# Patient Record
Sex: Female | Born: 1950 | Race: White | Hispanic: No | Marital: Married | Smoking: Never smoker
Health system: Southern US, Community
[De-identification: ages and names within clinical notes are randomized; demographics above are authoritative.]

## PROBLEM LIST (undated history)

## (undated) DIAGNOSIS — R011 Cardiac murmur, unspecified: Secondary | ICD-10-CM

## (undated) DIAGNOSIS — I340 Nonrheumatic mitral (valve) insufficiency: Secondary | ICD-10-CM

## (undated) DIAGNOSIS — G43909 Migraine, unspecified, not intractable, without status migrainosus: Secondary | ICD-10-CM

## (undated) DIAGNOSIS — T7840XA Allergy, unspecified, initial encounter: Secondary | ICD-10-CM

## (undated) DIAGNOSIS — M8580 Other specified disorders of bone density and structure, unspecified site: Secondary | ICD-10-CM

## (undated) DIAGNOSIS — L309 Dermatitis, unspecified: Secondary | ICD-10-CM

## (undated) DIAGNOSIS — F4321 Adjustment disorder with depressed mood: Secondary | ICD-10-CM

## (undated) DIAGNOSIS — C50919 Malignant neoplasm of unspecified site of unspecified female breast: Secondary | ICD-10-CM

## (undated) DIAGNOSIS — M5137 Other intervertebral disc degeneration, lumbosacral region: Secondary | ICD-10-CM

## (undated) HISTORY — PX: TONSILLECTOMY: SUR1361

## (undated) HISTORY — DX: Cardiac murmur, unspecified: R01.1

## (undated) HISTORY — PX: BREAST EXCISIONAL BIOPSY: SUR124

---

## 1997-01-31 HISTORY — PX: DILATION AND CURETTAGE, DIAGNOSTIC / THERAPEUTIC: SUR384

## 2006-03-07 ENCOUNTER — Ambulatory Visit: Payer: Self-pay | Admitting: Internal Medicine

## 2007-02-01 DIAGNOSIS — C50919 Malignant neoplasm of unspecified site of unspecified female breast: Secondary | ICD-10-CM

## 2007-02-01 HISTORY — DX: Malignant neoplasm of unspecified site of unspecified female breast: C50.919

## 2007-06-26 ENCOUNTER — Ambulatory Visit: Payer: Self-pay | Admitting: General Surgery

## 2007-06-29 ENCOUNTER — Ambulatory Visit: Payer: Self-pay | Admitting: General Surgery

## 2007-07-02 DIAGNOSIS — C50911 Malignant neoplasm of unspecified site of right female breast: Secondary | ICD-10-CM | POA: Insufficient documentation

## 2007-07-02 HISTORY — PX: MASTECTOMY: SHX3

## 2007-07-25 ENCOUNTER — Ambulatory Visit: Payer: Self-pay | Admitting: General Surgery

## 2007-07-27 ENCOUNTER — Ambulatory Visit: Payer: Self-pay | Admitting: General Surgery

## 2007-09-01 ENCOUNTER — Ambulatory Visit: Payer: Self-pay | Admitting: Oncology

## 2007-09-06 ENCOUNTER — Ambulatory Visit: Payer: Self-pay | Admitting: Oncology

## 2007-10-02 ENCOUNTER — Ambulatory Visit: Payer: Self-pay | Admitting: Oncology

## 2007-12-02 ENCOUNTER — Ambulatory Visit: Payer: Self-pay | Admitting: Oncology

## 2008-01-01 ENCOUNTER — Ambulatory Visit: Payer: Self-pay | Admitting: Oncology

## 2008-01-10 ENCOUNTER — Ambulatory Visit: Payer: Self-pay | Admitting: Oncology

## 2008-01-29 ENCOUNTER — Ambulatory Visit: Payer: Self-pay | Admitting: Unknown Physician Specialty

## 2008-02-01 ENCOUNTER — Ambulatory Visit: Payer: Self-pay | Admitting: Oncology

## 2008-07-01 ENCOUNTER — Ambulatory Visit: Payer: Self-pay | Admitting: Oncology

## 2008-07-08 ENCOUNTER — Ambulatory Visit: Payer: Self-pay | Admitting: General Surgery

## 2008-07-17 ENCOUNTER — Ambulatory Visit: Payer: Self-pay | Admitting: Oncology

## 2008-07-31 ENCOUNTER — Ambulatory Visit: Payer: Self-pay | Admitting: Oncology

## 2008-12-31 ENCOUNTER — Ambulatory Visit: Payer: Self-pay | Admitting: Oncology

## 2009-01-21 ENCOUNTER — Ambulatory Visit: Payer: Self-pay | Admitting: Oncology

## 2009-01-31 ENCOUNTER — Ambulatory Visit: Payer: Self-pay | Admitting: Oncology

## 2009-07-01 ENCOUNTER — Ambulatory Visit: Payer: Self-pay | Admitting: Oncology

## 2009-07-08 ENCOUNTER — Ambulatory Visit: Payer: Self-pay | Admitting: Oncology

## 2009-07-16 ENCOUNTER — Ambulatory Visit: Payer: Self-pay | Admitting: Oncology

## 2009-07-31 ENCOUNTER — Ambulatory Visit: Payer: Self-pay | Admitting: Oncology

## 2010-01-12 ENCOUNTER — Ambulatory Visit: Payer: Self-pay | Admitting: Oncology

## 2010-01-13 LAB — CANCER ANTIGEN 27.29: CA 27.29: 23.8 U/mL (ref 0.0–38.6)

## 2010-01-31 ENCOUNTER — Ambulatory Visit: Payer: Self-pay | Admitting: Oncology

## 2010-07-12 ENCOUNTER — Ambulatory Visit: Payer: Self-pay | Admitting: Oncology

## 2010-07-15 ENCOUNTER — Ambulatory Visit: Payer: Self-pay | Admitting: Oncology

## 2010-07-22 ENCOUNTER — Ambulatory Visit: Payer: Self-pay | Admitting: Oncology

## 2010-08-01 ENCOUNTER — Ambulatory Visit: Payer: Self-pay | Admitting: Oncology

## 2010-12-13 ENCOUNTER — Ambulatory Visit: Payer: Self-pay | Admitting: Surgery

## 2011-01-19 ENCOUNTER — Encounter: Payer: Self-pay | Admitting: Obstetrics and Gynecology

## 2011-01-19 ENCOUNTER — Ambulatory Visit: Payer: Self-pay | Admitting: Oncology

## 2011-01-19 DIAGNOSIS — Z01419 Encounter for gynecological examination (general) (routine) without abnormal findings: Secondary | ICD-10-CM

## 2011-02-01 ENCOUNTER — Ambulatory Visit: Payer: Self-pay | Admitting: Oncology

## 2011-02-01 HISTORY — PX: COLONOSCOPY: SHX174

## 2011-02-09 ENCOUNTER — Ambulatory Visit (INDEPENDENT_AMBULATORY_CARE_PROVIDER_SITE_OTHER): Payer: Managed Care, Other (non HMO) | Admitting: Obstetrics and Gynecology

## 2011-02-09 ENCOUNTER — Encounter: Payer: Self-pay | Admitting: Obstetrics and Gynecology

## 2011-02-09 VITALS — BP 114/59 | HR 69 | Ht 65.5 in | Wt 132.5 lb

## 2011-02-09 DIAGNOSIS — Z1272 Encounter for screening for malignant neoplasm of vagina: Secondary | ICD-10-CM

## 2011-02-09 DIAGNOSIS — Z01419 Encounter for gynecological examination (general) (routine) without abnormal findings: Secondary | ICD-10-CM

## 2011-02-09 NOTE — Patient Instructions (Signed)

## 2011-02-09 NOTE — Progress Notes (Signed)
  Subjective:     Kristie Valencia is a 61 y.o. female with BMI 22 and is here for a comprehensive physical exam. The patient reports problems with vaginal dryness. Patient has been postmenopausal for at least 10 years and is being treated a SERM for breast cancer. Patient was prescribed estrace cream but never used it due to loss of applicator. Patient is otherwise without complaints. She is not sexually active due to the vaginal discomfort. Patient is otherwise all up to date with her health care maintenance. She is scheduled for a mammogram in June, she had a colonoscopy 2-3 years ago. She had a bone density scan and had all blood work done last year by her PCP.  History   Social History  . Marital Status: Married    Spouse Name: N/A    Number of Children: N/A  . Years of Education: N/A   Occupational History  . Not on file.   Social History Main Topics  . Smoking status: Never Smoker   . Smokeless tobacco: Not on file  . Alcohol Use: 0.6 oz/week    1 Glasses of wine per week  . Drug Use: No  . Sexually Active: Yes   Other Topics Concern  . Not on file   Social History Narrative  . No narrative on file   No health maintenance topics applied.  Past Medical History  Diagnosis Date  . Cancer     Past Surgical History  Procedure Date  . Tonsillectomy   . Mastectomy 07/02/07    rt breast   Family History  Problem Relation Age of Onset  . Cancer Mother   . Heart disease Mother   . Cancer Father        Review of Systems A comprehensive review of systems was negative.   Objective:     GENERAL: Well-developed, well-nourished female in no acute distress.  HEENT: Normocephalic, atraumatic. Sclerae anicteric.  NECK: Supple. Normal thyroid.  LUNGS: Clear to auscultation bilaterally.  HEART: Regular rate and rhythm. BREASTS: Right breast surgically removed. No palpable masses or lymphadenopathy, skin changes, or nipple drainage. ABDOMEN: Soft, nontender, nondistended.  No organomegaly. PELVIC: Normal external female genitalia. Vagina is atrophic. Atrophic appearing cervix. Uterus is normal in size. No adnexal mass or tenderness. EXTREMITIES: No cyanosis, clubbing, or edema, 2+ distal pulses.    Assessment:    Healthy female exam.     Plan:    Pap smear collected Patient advised to continue monthly self breast and vulva exam Patient to continue taking calcium and vitamin D supplements Patient advised to use estrace cream (sample with applicator provided). Patient to use 1-3 times weekly and gradually decrease the frequency until improvement of symptoms Patient will be contacted with any abnormal results See After Visit Summary for Counseling Recommendations

## 2011-07-13 ENCOUNTER — Ambulatory Visit: Payer: Self-pay | Admitting: Oncology

## 2011-07-15 ENCOUNTER — Ambulatory Visit: Payer: Self-pay | Admitting: Oncology

## 2011-08-03 ENCOUNTER — Ambulatory Visit: Payer: Self-pay | Admitting: Oncology

## 2011-08-03 LAB — COMPREHENSIVE METABOLIC PANEL
Anion Gap: 2 — ABNORMAL LOW (ref 7–16)
Bilirubin,Total: 0.3 mg/dL (ref 0.2–1.0)
Calcium, Total: 9.5 mg/dL (ref 8.5–10.1)
Chloride: 107 mmol/L (ref 98–107)
Co2: 33 mmol/L — ABNORMAL HIGH (ref 21–32)
Creatinine: 0.85 mg/dL (ref 0.60–1.30)
EGFR (African American): >60
EGFR (Non-African Amer.): >60
Glucose: 94 mg/dL (ref 65–99)
SGOT(AST): 27 U/L (ref 15–37)
SGPT (ALT): 29 U/L
Sodium: 142 mmol/L (ref 136–145)
Total Protein: 6.9 g/dL (ref 6.4–8.2)

## 2011-08-03 LAB — CBC CANCER CENTER
Basophil #: 0 x10 3/mm (ref 0.0–0.1)
Basophil %: 0.4 %
Eosinophil #: 0.1 x10 3/mm (ref 0.0–0.7)
Eosinophil %: 1.2 %
HCT: 39.6 % (ref 35.0–47.0)
HGB: 13.1 g/dL (ref 12.0–16.0)
Lymphocyte #: 1.4 x10 3/mm (ref 1.0–3.6)
Lymphocyte %: 22.1 %
MCHC: 33 g/dL (ref 32.0–36.0)
MCV: 96 fL (ref 80–100)
Monocyte #: 0.6 x10 3/mm (ref 0.2–0.9)
Monocyte %: 10.1 %
Neutrophil #: 4.2 x10 3/mm (ref 1.4–6.5)
RBC: 4.14 10*6/uL (ref 3.80–5.20)
RDW: 11.7 % (ref 11.5–14.5)

## 2011-08-11 ENCOUNTER — Other Ambulatory Visit: Payer: Self-pay | Admitting: General Surgery

## 2011-08-11 DIAGNOSIS — R921 Mammographic calcification found on diagnostic imaging of breast: Secondary | ICD-10-CM

## 2011-08-18 ENCOUNTER — Ambulatory Visit
Admission: RE | Admit: 2011-08-18 | Discharge: 2011-08-18 | Disposition: A | Discharge status: Home / Self Care | Payer: Managed Care, Other (non HMO) | Source: Ambulatory Visit | Attending: General Surgery | Admitting: General Surgery

## 2011-08-18 DIAGNOSIS — R921 Mammographic calcification found on diagnostic imaging of breast: Secondary | ICD-10-CM

## 2011-08-18 MED ORDER — GADOBENATE DIMEGLUMINE 529 MG/ML IV SOLN
12.0000 mL | Freq: Once | INTRAVENOUS | Status: AC | PRN
  Administered 2011-08-18: 12 mL via INTRAVENOUS

## 2011-09-01 ENCOUNTER — Ambulatory Visit: Payer: Self-pay | Admitting: Oncology

## 2011-09-01 HISTORY — PX: BREAST BIOPSY: SHX20

## 2011-09-05 ENCOUNTER — Ambulatory Visit: Payer: Self-pay | Admitting: General Surgery

## 2011-09-06 LAB — PATHOLOGY REPORT

## 2012-02-06 ENCOUNTER — Ambulatory Visit: Payer: Self-pay | Admitting: Oncology

## 2012-02-06 LAB — CBC CANCER CENTER
Basophil #: 0.1 x10 3/mm (ref 0.0–0.1)
Eosinophil #: 0.1 x10 3/mm (ref 0.0–0.7)
Eosinophil %: 2 %
HCT: 40.6 % (ref 35.0–47.0)
HGB: 14 g/dL (ref 12.0–16.0)
Lymphocyte #: 1.6 x10 3/mm (ref 1.0–3.6)
Lymphocyte %: 29.4 %
MCV: 93 fL (ref 80–100)
Monocyte %: 12.6 %
Neutrophil %: 54.9 %
RBC: 4.37 10*6/uL (ref 3.80–5.20)
RDW: 11.7 % (ref 11.5–14.5)
WBC: 5.4 x10 3/mm (ref 3.6–11.0)

## 2012-02-06 LAB — COMPREHENSIVE METABOLIC PANEL
Alkaline Phosphatase: 88 U/L (ref 50–136)
Bilirubin,Total: 0.2 mg/dL (ref 0.2–1.0)
Calcium, Total: 8.9 mg/dL (ref 8.5–10.1)
Chloride: 102 mmol/L (ref 98–107)
Co2: 30 mmol/L (ref 21–32)
EGFR (African American): >60
EGFR (Non-African Amer.): >60
Glucose: 97 mg/dL (ref 65–99)
Osmolality: 281 (ref 275–301)
Potassium: 4.3 mmol/L (ref 3.5–5.1)
SGOT(AST): 27 U/L (ref 15–37)
Total Protein: 7.3 g/dL (ref 6.4–8.2)

## 2012-02-07 LAB — CANCER ANTIGEN 27.29: CA 27.29: 19.6 U/mL (ref 0.0–38.6)

## 2012-02-15 ENCOUNTER — Ambulatory Visit: Payer: Self-pay | Admitting: General Surgery

## 2012-03-03 ENCOUNTER — Ambulatory Visit: Payer: Self-pay | Admitting: Oncology

## 2012-07-31 ENCOUNTER — Ambulatory Visit: Payer: Self-pay | Admitting: Internal Medicine

## 2012-08-10 ENCOUNTER — Encounter: Payer: Self-pay | Admitting: *Deleted

## 2012-08-13 LAB — COMPREHENSIVE METABOLIC PANEL
Alkaline Phosphatase: 88 U/L (ref 50–136)
BUN: 19 mg/dL — ABNORMAL HIGH (ref 7–18)
Bilirubin,Total: 0.3 mg/dL (ref 0.2–1.0)
Chloride: 104 mmol/L (ref 98–107)
EGFR (African American): >60
Glucose: 95 mg/dL (ref 65–99)
Osmolality: 283 (ref 275–301)
Potassium: 4.2 mmol/L (ref 3.5–5.1)
SGPT (ALT): 26 U/L (ref 12–78)
Sodium: 141 mmol/L (ref 136–145)
Total Protein: 7 g/dL (ref 6.4–8.2)

## 2012-08-13 LAB — CBC CANCER CENTER
Basophil #: 0 x10 3/mm (ref 0.0–0.1)
Basophil %: 0.8 %
Lymphocyte %: 26.9 %
Monocyte %: 11.8 %
Neutrophil %: 59.3 %
RDW: 11.9 % (ref 11.5–14.5)
WBC: 5.4 x10 3/mm (ref 3.6–11.0)

## 2012-08-20 ENCOUNTER — Telehealth: Payer: Self-pay | Admitting: *Deleted

## 2012-08-20 NOTE — Telephone Encounter (Signed)
Patient was contacted today to let her know that we faxed a prescription to [facility] for two mastectomy bras. The original prescription was also mailed to the patient. She verbalizes understanding.

## 2012-08-31 ENCOUNTER — Ambulatory Visit: Payer: Self-pay | Admitting: Internal Medicine

## 2012-12-24 ENCOUNTER — Encounter: Payer: Managed Care, Other (non HMO) | Admitting: Family Medicine

## 2013-02-18 ENCOUNTER — Ambulatory Visit: Payer: Self-pay | Admitting: Oncology

## 2013-03-12 ENCOUNTER — Ambulatory Visit: Payer: Self-pay | Admitting: General Surgery

## 2013-04-09 ENCOUNTER — Encounter: Payer: Self-pay | Admitting: *Deleted

## 2013-09-04 ENCOUNTER — Ambulatory Visit: Payer: Self-pay | Admitting: Oncology

## 2013-10-01 ENCOUNTER — Ambulatory Visit: Payer: Self-pay | Admitting: Oncology

## 2013-12-02 ENCOUNTER — Encounter: Payer: Self-pay | Admitting: *Deleted

## 2014-02-19 ENCOUNTER — Ambulatory Visit: Payer: Self-pay | Admitting: Oncology

## 2014-09-11 ENCOUNTER — Inpatient Hospital Stay: Payer: Managed Care, Other (non HMO) | Admitting: Oncology

## 2014-09-17 ENCOUNTER — Inpatient Hospital Stay: Payer: Managed Care, Other (non HMO) | Attending: Oncology | Admitting: Oncology

## 2014-09-17 DIAGNOSIS — Z809 Family history of malignant neoplasm, unspecified: Secondary | ICD-10-CM | POA: Insufficient documentation

## 2014-09-17 DIAGNOSIS — F329 Major depressive disorder, single episode, unspecified: Secondary | ICD-10-CM | POA: Insufficient documentation

## 2014-09-17 DIAGNOSIS — M5137 Other intervertebral disc degeneration, lumbosacral region: Secondary | ICD-10-CM | POA: Insufficient documentation

## 2014-09-17 DIAGNOSIS — Z9011 Acquired absence of right breast and nipple: Secondary | ICD-10-CM | POA: Insufficient documentation

## 2014-09-17 DIAGNOSIS — Z853 Personal history of malignant neoplasm of breast: Secondary | ICD-10-CM | POA: Insufficient documentation

## 2014-09-17 DIAGNOSIS — I340 Nonrheumatic mitral (valve) insufficiency: Secondary | ICD-10-CM | POA: Insufficient documentation

## 2014-09-17 DIAGNOSIS — R011 Cardiac murmur, unspecified: Secondary | ICD-10-CM | POA: Insufficient documentation

## 2014-09-17 DIAGNOSIS — Z8669 Personal history of other diseases of the nervous system and sense organs: Secondary | ICD-10-CM | POA: Insufficient documentation

## 2014-09-17 DIAGNOSIS — L309 Dermatitis, unspecified: Secondary | ICD-10-CM | POA: Insufficient documentation

## 2014-09-17 DIAGNOSIS — M8580 Other specified disorders of bone density and structure, unspecified site: Secondary | ICD-10-CM | POA: Insufficient documentation

## 2014-09-17 DIAGNOSIS — Z79899 Other long term (current) drug therapy: Secondary | ICD-10-CM | POA: Insufficient documentation

## 2014-09-26 ENCOUNTER — Encounter: Payer: Self-pay | Admitting: *Deleted

## 2014-09-29 ENCOUNTER — Ambulatory Visit
Admission: RE | Admit: 2014-09-29 | Discharge: 2014-09-29 | Disposition: A | Discharge status: Home / Self Care | Payer: Managed Care, Other (non HMO) | Source: Ambulatory Visit | Attending: Unknown Physician Specialty | Admitting: Unknown Physician Specialty

## 2014-09-29 ENCOUNTER — Encounter
Admission: RE | Disposition: A | Discharge status: Home / Self Care | Payer: Self-pay | Source: Ambulatory Visit | Attending: Unknown Physician Specialty

## 2014-09-29 ENCOUNTER — Encounter: Payer: Self-pay | Admitting: *Deleted

## 2014-09-29 ENCOUNTER — Ambulatory Visit: Payer: Managed Care, Other (non HMO) | Admitting: Anesthesiology

## 2014-09-29 DIAGNOSIS — M1990 Unspecified osteoarthritis, unspecified site: Secondary | ICD-10-CM | POA: Insufficient documentation

## 2014-09-29 DIAGNOSIS — Z800 Family history of malignant neoplasm of digestive organs: Secondary | ICD-10-CM | POA: Insufficient documentation

## 2014-09-29 DIAGNOSIS — K640 First degree hemorrhoids: Secondary | ICD-10-CM | POA: Diagnosis not present

## 2014-09-29 DIAGNOSIS — I340 Nonrheumatic mitral (valve) insufficiency: Secondary | ICD-10-CM | POA: Insufficient documentation

## 2014-09-29 DIAGNOSIS — Z809 Family history of malignant neoplasm, unspecified: Secondary | ICD-10-CM | POA: Diagnosis not present

## 2014-09-29 DIAGNOSIS — L309 Dermatitis, unspecified: Secondary | ICD-10-CM | POA: Insufficient documentation

## 2014-09-29 DIAGNOSIS — Z853 Personal history of malignant neoplasm of breast: Secondary | ICD-10-CM | POA: Insufficient documentation

## 2014-09-29 DIAGNOSIS — R011 Cardiac murmur, unspecified: Secondary | ICD-10-CM | POA: Diagnosis not present

## 2014-09-29 DIAGNOSIS — Z9011 Acquired absence of right breast and nipple: Secondary | ICD-10-CM | POA: Diagnosis not present

## 2014-09-29 DIAGNOSIS — F4321 Adjustment disorder with depressed mood: Secondary | ICD-10-CM | POA: Diagnosis not present

## 2014-09-29 DIAGNOSIS — M8580 Other specified disorders of bone density and structure, unspecified site: Secondary | ICD-10-CM | POA: Insufficient documentation

## 2014-09-29 DIAGNOSIS — D123 Benign neoplasm of transverse colon: Secondary | ICD-10-CM | POA: Insufficient documentation

## 2014-09-29 DIAGNOSIS — Z1211 Encounter for screening for malignant neoplasm of colon: Secondary | ICD-10-CM | POA: Diagnosis not present

## 2014-09-29 HISTORY — DX: Migraine, unspecified, not intractable, without status migrainosus: G43.909

## 2014-09-29 HISTORY — DX: Dermatitis, unspecified: L30.9

## 2014-09-29 HISTORY — DX: Allergy, unspecified, initial encounter: T78.40XA

## 2014-09-29 HISTORY — PX: COLONOSCOPY WITH PROPOFOL: SHX5780

## 2014-09-29 HISTORY — DX: Adjustment disorder with depressed mood: F43.21

## 2014-09-29 HISTORY — DX: Nonrheumatic mitral (valve) insufficiency: I34.0

## 2014-09-29 HISTORY — DX: Malignant neoplasm of unspecified site of unspecified female breast: C50.919

## 2014-09-29 HISTORY — DX: Other specified disorders of bone density and structure, unspecified site: M85.80

## 2014-09-29 HISTORY — DX: Other intervertebral disc degeneration, lumbosacral region: M51.37

## 2014-09-29 SURGERY — COLONOSCOPY WITH PROPOFOL
Anesthesia: General

## 2014-09-29 MED ORDER — LIDOCAINE HCL (CARDIAC) 20 MG/ML IV SOLN
INTRAVENOUS | Status: DC | PRN
  Administered 2014-09-29: 60 mg via INTRAVENOUS

## 2014-09-29 MED ORDER — PROPOFOL 10 MG/ML IV BOLUS
INTRAVENOUS | Status: DC | PRN
  Administered 2014-09-29: 80 mg via INTRAVENOUS

## 2014-09-29 MED ORDER — SODIUM CHLORIDE 0.9 % IV SOLN: INTRAVENOUS | Status: DC

## 2014-09-29 MED ORDER — PROPOFOL INFUSION 10 MG/ML OPTIME
INTRAVENOUS | Status: DC | PRN
  Administered 2014-09-29: 140 ug/kg/min via INTRAVENOUS

## 2014-09-29 MED ORDER — SODIUM CHLORIDE 0.9 % IV SOLN
INTRAVENOUS | Status: DC
  Administered 2014-09-29: 15:00:00 via INTRAVENOUS

## 2014-09-29 NOTE — H&P (Signed)
   Primary Care Physician:  Adin Hector, MD Primary Gastroenterologist:  Dr. Vira Agar  Pre-Procedure History & Physical: HPI:  Kristie Valencia is a 64 y.o. female is here for an colonoscopy.   Past Medical History  Diagnosis Date  . Heart murmur   . Cancer 2009    Right mastectomy  . Allergic state   . Osteopenia   . Eczema   . Situational depression   . Migraine   . Mitral regurgitation   . Breast cancer Right    Past Surgical History  Procedure Laterality Date  . Tonsillectomy    . Mastectomy  07/02/07    rt breast  . Breast surgery Right     infiltrating ductal carcinoma  . Tonsillectomy    . Colonoscopy      Prior to Admission medications   Medication Sig Start Date End Date Taking? Authorizing Provider  zolpidem (AMBIEN) 5 MG tablet Take 5 mg by mouth at bedtime as needed for sleep.   Yes Historical Provider, MD  anastrozole (ARIMIDEX) 1 MG tablet Take 1 mg by mouth daily.    Historical Provider, MD  Multiple Vitamin (MULTIVITAMIN) tablet Take 1 tablet by mouth daily.    Historical Provider, MD    Allergies as of 07/22/2014  . (No Known Allergies)    Family History  Problem Relation Age of Onset  . Cancer Mother   . Heart disease Mother   . Cancer Father     Social History   Social History  . Marital Status: Married    Spouse Name: N/A  . Number of Children: N/A  . Years of Education: N/A   Occupational History  . Not on file.   Social History Main Topics  . Smoking status: Never Smoker   . Smokeless tobacco: Never Used  . Alcohol Use: 0.6 oz/week    1 Glasses of wine per week  . Drug Use: No  . Sexual Activity: Yes   Other Topics Concern  . Not on file   Social History Narrative    Review of Systems: See HPI, otherwise negative ROS  Physical Exam: BP 119/57 mmHg  Pulse 62  Temp(Src) 96.7 F (35.9 C) (Tympanic)  Resp 20  Ht 5\' 5"  (1.651 m)  Wt 61.236 kg (135 lb)  BMI 22.47 kg/m2  SpO2 99% General:   Alert,  pleasant and  cooperative in NAD Head:  Normocephalic and atraumatic. Neck:  Supple; no masses or thyromegaly. Lungs:  Clear throughout to auscultation.    Heart:  Regular rate and rhythm. Abdomen:  Soft, nontender and nondistended. Normal bowel sounds, without guarding, and without rebound.   Neurologic:  Alert and  oriented x4;  grossly normal neurologically.  Impression/Plan: Kristie Valencia is here for an colonoscopy to be performed for FH colon cancer in father  Risks, benefits, limitations, and alternatives regarding  colonoscopy have been reviewed with the patient.  Questions have been answered.  All parties agreeable.   Gaylyn Cheers, MD  09/29/2014, 3:26 PM

## 2014-09-29 NOTE — Transfer of Care (Signed)
Immediate Anesthesia Transfer of Care Note  Patient: Kristie Valencia  Procedure(s) Performed: Procedure(s): COLONOSCOPY WITH PROPOFOL (N/A)  Patient Location: PACU  Anesthesia Type:General  Level of Consciousness: awake, alert  and oriented  Airway & Oxygen Therapy: Patient Spontanous Breathing and Patient connected to nasal cannula oxygen  Post-op Assessment: Report given to RN and Post -op Vital signs reviewed and stable  Post vital signs: Reviewed and stable  Last Vitals:  Filed Vitals:   09/29/14 1556  BP: 117/45  Pulse: 68  Temp: 36.9 C  Resp: 16    Complications: No apparent anesthesia complications

## 2014-09-29 NOTE — Op Note (Signed)
[facility] Gastroenterology Patient Name: Kristie Valencia Procedure Date: 09/29/2014 3:16 PM MRN: [id number] Account #: [id number] Date of Birth: 09-14-1950 Admit Type: Outpatient Age: 64 Room: [facility] ENDO ROOM 1 Gender: Female Note Status: Finalized Procedure:         Colonoscopy Indications:       Screening in patient at increased risk: Family history of                     1st-degree relative with colorectal cancer Providers:         Manya Silvas, MD Referring MD:      Ramonita Lab, MD (Referring MD) Medicines:         Propofol per Anesthesia Complications:     No immediate complications. Procedure:         Pre-Anesthesia Assessment:                    - After reviewing the risks and benefits, the patient was                     deemed in satisfactory condition to undergo the procedure.                    After obtaining informed consent, the colonoscope was                     passed under direct vision. Throughout the procedure, the                     patient's blood pressure, pulse, and oxygen saturations                     were monitored continuously. The Colonoscope was                     introduced through the anus and advanced to the the cecum,                     identified by appendiceal orifice and ileocecal valve. The                     colonoscopy was performed without difficulty. The patient                     tolerated the procedure well. The quality of the bowel                     preparation was good. Findings:      A small polyp was found in the transverse colon. The polyp was sessile.       The polyp was removed with a hot snare. Resection and retrieval were       complete. A clip was applied to the small divot.      Internal hemorrhoids were found during endoscopy. The hemorrhoids were       small and Grade I (internal hemorrhoids that do not prolapse). Impression:        - One small polyp in the transverse colon. Resected and                  retrieved.                    - Internal hemorrhoids. Recommendation:    - Await pathology results.                    -  The findings and recommendations were discussed with the                     patient's family. Manya Silvas, MD 09/29/2014 3:54:10 PM This report has been signed electronically. Number of Addenda: 0 Note Initiated On: 09/29/2014 3:16 PM Scope Withdrawal Time: 0 hours 13 minutes 36 seconds  Total Procedure Duration: 0 hours 18 minutes 18 seconds       [facility]

## 2014-09-29 NOTE — Anesthesia Preprocedure Evaluation (Signed)
Anesthesia Evaluation  Patient identified by MRN, date of birth, ID band Patient awake    Reviewed: Allergy & Precautions, NPO status , Patient's Chart, lab work & pertinent test results, reviewed documented beta blocker date and time   Airway Mallampati: II  TM Distance: >3 FB     Dental  (+) Chipped   Pulmonary          Cardiovascular + Valvular Problems/Murmurs     Neuro/Psych  Headaches, PSYCHIATRIC DISORDERS Depression    GI/Hepatic   Endo/Other    Renal/GU      Musculoskeletal  (+) Arthritis -,   Abdominal   Peds  Hematology   Anesthesia Other Findings   Reproductive/Obstetrics                             Anesthesia Physical Anesthesia Plan  ASA: II  Anesthesia Plan: General   Post-op Pain Management:    Induction:   Airway Management Planned: Nasal Cannula  Additional Equipment:   Intra-op Plan:   Post-operative Plan:   Informed Consent: I have reviewed the patients History and Physical, chart, labs and discussed the procedure including the risks, benefits and alternatives for the proposed anesthesia with the patient or authorized representative who has indicated his/her understanding and acceptance.     Plan Discussed with: CRNA  Anesthesia Plan Comments:         Anesthesia Quick Evaluation

## 2014-09-30 ENCOUNTER — Encounter: Payer: Self-pay | Admitting: Unknown Physician Specialty

## 2014-10-01 ENCOUNTER — Inpatient Hospital Stay (HOSPITAL_BASED_OUTPATIENT_CLINIC_OR_DEPARTMENT_OTHER): Payer: Managed Care, Other (non HMO) | Admitting: Oncology

## 2014-10-01 ENCOUNTER — Encounter: Payer: Self-pay | Admitting: Oncology

## 2014-10-01 VITALS — BP 114/69 | HR 54 | Temp 95.5°F | Wt 137.4 lb

## 2014-10-01 DIAGNOSIS — Z8669 Personal history of other diseases of the nervous system and sense organs: Secondary | ICD-10-CM | POA: Diagnosis not present

## 2014-10-01 DIAGNOSIS — M5137 Other intervertebral disc degeneration, lumbosacral region: Secondary | ICD-10-CM | POA: Diagnosis not present

## 2014-10-01 DIAGNOSIS — R011 Cardiac murmur, unspecified: Secondary | ICD-10-CM

## 2014-10-01 DIAGNOSIS — Z809 Family history of malignant neoplasm, unspecified: Secondary | ICD-10-CM | POA: Diagnosis not present

## 2014-10-01 DIAGNOSIS — I340 Nonrheumatic mitral (valve) insufficiency: Secondary | ICD-10-CM

## 2014-10-01 DIAGNOSIS — L309 Dermatitis, unspecified: Secondary | ICD-10-CM | POA: Diagnosis not present

## 2014-10-01 DIAGNOSIS — M51379 Other intervertebral disc degeneration, lumbosacral region without mention of lumbar back pain or lower extremity pain: Secondary | ICD-10-CM | POA: Insufficient documentation

## 2014-10-01 DIAGNOSIS — F329 Major depressive disorder, single episode, unspecified: Secondary | ICD-10-CM

## 2014-10-01 DIAGNOSIS — M8580 Other specified disorders of bone density and structure, unspecified site: Secondary | ICD-10-CM | POA: Insufficient documentation

## 2014-10-01 DIAGNOSIS — Z9011 Acquired absence of right breast and nipple: Secondary | ICD-10-CM | POA: Diagnosis not present

## 2014-10-01 DIAGNOSIS — Z79899 Other long term (current) drug therapy: Secondary | ICD-10-CM

## 2014-10-01 DIAGNOSIS — Z853 Personal history of malignant neoplasm of breast: Secondary | ICD-10-CM

## 2014-10-01 HISTORY — DX: Other intervertebral disc degeneration, lumbosacral region: M51.37

## 2014-10-01 HISTORY — DX: Other intervertebral disc degeneration, lumbosacral region without mention of lumbar back pain or lower extremity pain: M51.379

## 2014-10-01 LAB — SURGICAL PATHOLOGY

## 2014-10-01 NOTE — Anesthesia Postprocedure Evaluation (Signed)
  Anesthesia Post-op Note  Patient: Kristie Valencia  Procedure(s) Performed: Procedure(s): COLONOSCOPY WITH PROPOFOL (N/A)  Anesthesia type:General  Patient location: PACU  Post pain: Pain level controlled  Post assessment: Post-op Vital signs reviewed, Patient's Cardiovascular Status Stable, Respiratory Function Stable, Patent Airway and No signs of Nausea or vomiting  Post vital signs: Reviewed and stable  Last Vitals:  Filed Vitals:   09/29/14 1625  BP: 139/44  Pulse: 63  Temp:   Resp: 15    Level of consciousness: awake, alert  and patient cooperative  Complications: No apparent anesthesia complications

## 2014-10-01 NOTE — Progress Notes (Signed)
Patient does not have living will.  Never smoked. 

## 2014-10-06 NOTE — Progress Notes (Signed)
[place] @ [facility] [phone]  Fax:([phone]) [place]: July 16, 1950  MR#: [id number]  OVA#:[id number]  Patient Care Team: Adin Hector, MD as PCP - General (Internal Medicine)  CHIEF COMPLAINT:  Chief Complaint/Diagnosis:   carcinoma of breast, status post modified radical mastectomy.  Right breast, June of 2009. 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  Surgeon receptor progesterone receptor positive.  Patient is not on any anti-hormonal therapy at present time.  Patient has finished 5 years of anti-hormonal therapy.  Getting regular mammograms done  REVIEW OF SYSTEMS:   GENERAL:  Feels good.  Active.  No fevers, sweats or weight loss. PERFORMANCE STATUS (ECOG): 0 HEENT:  No visual changes, runny nose, sore throat, mouth sores or tenderness. Lungs: No shortness of breath or cough.  No hemoptysis. Cardiac:  No chest pain, palpitations, orthopnea, or PND. GI:  No nausea, vomiting, diarrhea, constipation, melena or hematochezia. GU:  No urgency, frequency, dysuria, or hematuria. Musculoskeletal:  No back pain.  No joint pain.  No muscle tenderness. Extremities:  No pain or swelling. Skin:  No rashes or skin changes. Neuro:  No headache, numbness or weakness, balance or coordination issues. Endocrine:  No diabetes, thyroid issues, hot flashes or night sweats. Psych:  No mood changes, depression or anxiety. Pain:  No focal pain. Review of systems:  All other systems reviewed and found to be negative. As per HPI. Otherwise, a complete review of systems is negatve.  PAST MEDICAL  HISTORY: Past Medical History  Diagnosis Date  . Heart murmur   . Cancer 2009    Right mastectomy  . Allergic state   . Osteopenia   . Eczema   . Situational depression   . Migraine   . Mitral regurgitation   . Breast cancer Right  . DDD (degenerative disc disease), 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    PAST SURGICAL HISTORY: Past Surgical History  Procedure Laterality Date  . Tonsillectomy    . Mastectomy  07/02/07    rt breast  . Breast surgery Right     infiltrating ductal carcinoma  . Tonsillectomy    . Colonoscopy    . Colonoscopy with propofol N/A 09/29/2014    Procedure: COLONOSCOPY WITH PROPOFOL;  Surgeon: Manya Silvas, MD;  Location: [facility] ENDOSCOPY;  Service: Endoscopy;  Laterality: N/A;    FAMILY HISTORY Family History  Problem Relation Age of Onset  . Cancer Mother   . Heart disease Mother   . Cancer Father     ADVANCED DIRECTIVES:  No flowsheet data found.  HEALTH MAINTENANCE: Social History  Substance Use Topics  . Smoking status: Never Smoker   . Smokeless tobacco: Never Used  . Alcohol Use: 0.6 oz/week    1 Glasses of wine per week      No Known Allergies  Current Outpatient Prescriptions  Medication Sig Dispense Refill  . Cholecalciferol (VITAMIN D3) 2000 UNITS capsule Take by mouth.    . zolpidem (AMBIEN) 5 MG tablet Take 5 mg by mouth at bedtime as needed for sleep.     No current facility-administered medications for this visit.   Significant History/PMH:   Anemia:  Breast Cancer:    Mastectomy:   Preventive Screening:  Has patient had any of the following test? Mammography (1)      PFSH: Additional Past Medical and Surgical History: does not smoke.  Does not drink.  Mother had breast cancer x2.  Father died of colon cancer.  On prolonged estrogen replacement therapy   OBJECTIVE:  Filed Vitals:   10/01/14 1102  BP: 114/69  Pulse: 54  Temp: 95.5 F (35.3 C)     Body mass index is 22.86 kg/(m^2).    ECOG FS:0 -  Asymptomatic  PHYSICAL EXAM: GENERAL:  Well developed, well nourished, sitting comfortably in the exam room in no acute distress. MENTAL STATUS:  Alert and oriented to person, place and time.  ENT:  Oropharynx clear without lesion.  Tongue normal. Mucous membranes moist.  RESPIRATORY:  Clear to auscultation without rales, wheezes or rhonchi. CARDIOVASCULAR:  Regular rate and rhythm without murmur, rub or gallop. BREAST:  Right breast mastectomy no evidence of recurrent disease  Left breast without masses, skin changes or nipple discharge. ABDOMEN:  Soft, non-tender, with active bowel sounds, and no hepatosplenomegaly.  No masses. BACK:  No CVA tenderness.  No tenderness on percussion of the back or rib cage. SKIN:  No rashes, ulcers or lesions. EXTREMITIES: No edema, no skin discoloration or tenderness.  No palpable cords. LYMPH NODES: No palpable cervical, supraclavicular, axillary or inguinal adenopathy  NEUROLOGICAL: Unremarkable. PSYCH:  Appropriate.   LAB RESULTS:  CBC Latest Ref Rng 08/13/2012 02/06/2012  WBC 3.6-11.0 x10 3/mm  5.4 5.4  Hemoglobin 12.0-16.0 g/dL 13.9 14.0  Hematocrit 35.0-47.0 % 39.5 40.6  Platelets 150-440 x10 3/mm  216 219    Admission on 09/29/2014, Discharged on 09/29/2014  Component Date Value Ref Range Status  . SURGICAL PATHOLOGY 09/29/2014    Final                   Value:Surgical Pathology CASE: [id number] PATIENT: Glennon Mac Surgical Pathology Report     SPECIMEN SUBMITTED: A. Colon polyp, transverse, hot snare  CLINICAL HISTORY: None provided  PRE-OPERATIVE DIAGNOSIS: Family hx of colon cancer  POST-OPERATIVE DIAGNOSIS: Colon polyp, int hemorrhoids     DIAGNOSIS: A. COLON POLYP, TRANSVERSE; HOT SNARE: - SESSILE SERRATED ADENOMA. - NEGATIVE FOR CYTOLOGIC DYSPLASIA AND MALIGNANCY.   GROSS DESCRIPTION: A. Labeled: hot snare TC polyp Tissue Fragment(s): multiple Measurement: aggregate, 1.0 x 0.3 x 0.1 cm Comment: tan  fragment and fecal material, marked blue  Entirely submitted in cassette(s): 1  Final Diagnosis performed by Bryan Lemma, MD.  Electronically signed 10/01/2014 10:32:32AM    The electronic signature indicates that the named Attending Pathologist has evaluated the specimen  Technical component performed at [facility], [address] [place], [place] [address] Lab: [phone] Dir: Darrick Penna. Truitt Merle, MD  Professional component performed at [facility], [facility], [place], [place], [place] [address] Lab: [phone] Dir: Dellia Nims. Rubinas, MD        According to patient had a mammogram in January of 2016 and record not available was done outside institution  ASSESSMENT: Stage I carcinoma of breast.  Mastectomy on the right side.  No evidence of recurrent disease on clinical ground.  Mammogram in January of 2017 has been scheduled  MEDICAL DECISION MAKING:  Continue follow-up in 1 year  Patient expressed understanding and was in  agreement with this plan. She also understands that She can call clinic at any time with any questions, concerns, or complaints.    No matching staging information was found for the patient.  Forest Gleason, MD   10/06/2014 8:26 AM

## 2015-02-24 ENCOUNTER — Ambulatory Visit: Payer: Medicare Other | Attending: Oncology

## 2015-06-02 ENCOUNTER — Other Ambulatory Visit: Payer: Self-pay | Admitting: Internal Medicine

## 2015-06-02 DIAGNOSIS — Z853 Personal history of malignant neoplasm of breast: Secondary | ICD-10-CM

## 2015-06-03 ENCOUNTER — Other Ambulatory Visit: Payer: Self-pay | Admitting: Internal Medicine

## 2015-06-03 DIAGNOSIS — Z1231 Encounter for screening mammogram for malignant neoplasm of breast: Secondary | ICD-10-CM

## 2015-06-11 ENCOUNTER — Ambulatory Visit
Admission: RE | Admit: 2015-06-11 | Discharge: 2015-06-11 | Disposition: A | Discharge status: Home / Self Care | Payer: Managed Care, Other (non HMO) | Source: Ambulatory Visit | Attending: Internal Medicine | Admitting: Internal Medicine

## 2015-06-11 DIAGNOSIS — Z1231 Encounter for screening mammogram for malignant neoplasm of breast: Secondary | ICD-10-CM | POA: Diagnosis not present

## 2015-07-07 ENCOUNTER — Encounter: Payer: Self-pay | Admitting: Obstetrics and Gynecology

## 2015-07-07 ENCOUNTER — Ambulatory Visit (INDEPENDENT_AMBULATORY_CARE_PROVIDER_SITE_OTHER): Payer: Managed Care, Other (non HMO) | Admitting: Obstetrics and Gynecology

## 2015-07-07 VITALS — BP 110/55 | HR 62 | Ht 65.5 in | Wt 133.7 lb

## 2015-07-07 DIAGNOSIS — Z0000 Encounter for general adult medical examination without abnormal findings: Secondary | ICD-10-CM | POA: Diagnosis not present

## 2015-07-07 DIAGNOSIS — Z124 Encounter for screening for malignant neoplasm of cervix: Secondary | ICD-10-CM

## 2015-07-07 DIAGNOSIS — N952 Postmenopausal atrophic vaginitis: Secondary | ICD-10-CM

## 2015-07-07 DIAGNOSIS — Z01419 Encounter for gynecological examination (general) (routine) without abnormal findings: Secondary | ICD-10-CM

## 2015-07-07 DIAGNOSIS — M8580 Other specified disorders of bone density and structure, unspecified site: Secondary | ICD-10-CM

## 2015-07-07 MED ORDER — ESTRADIOL 0.1 MG/GM VA CREA: TOPICAL_CREAM | VAGINAL | Status: DC

## 2015-07-07 NOTE — Progress Notes (Signed)
GYN ANNUAL PREVENTATIVE CARE ENCOUNTER NOTE  Subjective:       Kristie Valencia is a 65 y.o. G2P2 female here for a routine annual gynecologic exam.  Current complaints: Severe vaginal atrophy  Patient presents from Dr. Ernst Spell for gynecologic evaluation.  Significant health history includes breast cancer diagnosed in 2009 for which she is status post modified right radical mastectomy; no chemotherapy or radiation therapy was given.  Patient is currently Showing no evidence of disease.  Patient does exercise regularly; she maintains a normal body mass index; she is with a diagnosis of osteopenia.  She is taking calcium and vitamin D supplementation.  Patient has been reluctant to maintain sexual relations with her husband because of severe vulvovaginal atrophy and dyspareunia.  Several years ago.  She did see Dr. Elly Modena from the [facility] faculty practice who placed her on vaginal estrogen cream therapy; she never used the medication.  She now is interested in proceeding with a trial of this cream.   Gynecologic History No LMP recorded. Patient is postmenopausal. Contraception: post menopausal status  Obstetric History OB History  Gravida Para Term Preterm AB SAB TAB Ectopic Multiple Living  2 2        2     # Outcome Date GA Lbr Len/2nd Weight Sex Delivery Anes PTL Lv  2 Para           1 Para             Obstetric Comments  1st Menstrual Cycle:  11  1st Pregnancy:  76    Past Medical History  Diagnosis Date  . Heart murmur   . Cancer [facility]) 2009    Right mastectomy  . Allergic state   . Osteopenia   . Eczema   . Situational depression   . Migraine   . Mitral regurgitation   . Breast cancer ([place]) Right  . 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 Tonsillectomy    . Mastectomy  07/02/07    rt breast  . Breast surgery Right     infiltrating ductal carcinoma  . Tonsillectomy    . Colonoscopy    .  Colonoscopy with propofol N/A 09/29/2014    Procedure: COLONOSCOPY WITH PROPOFOL;  Surgeon: Manya Silvas, MD;  Location: [facility] ENDOSCOPY;  Service: Endoscopy;  Laterality: N/A;  . Breast biopsy Left   . Breast biopsy Left 8/13    neg clip    Current Outpatient Prescriptions on File Prior to Visit  Medication Sig Dispense Refill  . Cholecalciferol (VITAMIN D3) 2000 UNITS capsule Take by mouth.    . zolpidem (AMBIEN) 5 MG tablet Take 5 mg by mouth at bedtime as needed for sleep.     No current facility-administered medications on file prior to visit.    No Known Allergies  Social History   Social History  . Marital Status: Married    Spouse Name: N/A  . Number of Children: N/A  . Years of Education: N/A   Occupational History  . Not on file.   Social History Main Topics  . Smoking status: Never Smoker   . Smokeless tobacco: Never Used  . Alcohol Use: 0.6 oz/week    1 Glasses of wine per week     Comment: rare  . Drug Use: No  . Sexual Activity: Yes    Birth Control/ Protection: Post-menopausal   Other Topics Concern  . Not on file  Social History Narrative    Family History  Problem Relation Age of Onset  . Heart disease Mother   . Breast cancer Mother 51    DX twice  . Cancer Father   . Diabetes Neg Hx   . Ovarian cancer Neg Hx     The following portions of the patient's history were reviewed and updated as appropriate: allergies, current medications, past family history, past medical history, past social history, past surgical history and problem list.  Review of Systems Review of Systems  Constitutional: Negative for fever, chills and weight loss.  HENT: Negative.   Respiratory: Negative.   Cardiovascular: Negative.   Gastrointestinal: Negative for abdominal pain, diarrhea, constipation and blood in stool.  Genitourinary: Negative for dysuria, urgency and frequency.  Musculoskeletal: Negative.   Skin: Negative for itching and rash.  Neurological:  Negative.  Negative for weakness.  Endo/Heme/Allergies: Negative.   Psychiatric/Behavioral: Negative.      Objective:   BP 110/55 mmHg  Pulse 62  Ht 5' 5.5" (1.664 m)  Wt 133 lb 11.2 oz (60.646 kg)  BMI 21.90 kg/m2 Physical Exam  Constitutional: She is oriented to person, place, and time. She appears well-developed and well-nourished.  HENT:  Head: Atraumatic.  Eyes: Conjunctivae and EOM are normal.  Neck: Normal range of motion. Neck supple. No thyromegaly present.  Cardiovascular: Normal rate and regular rhythm.   No murmur heard. Pulmonary/Chest: Effort normal and breath sounds normal. She exhibits no mass, no tenderness, no edema and no swelling. Right breast exhibits no mass, no skin change and no tenderness. Left breast exhibits no mass, no nipple discharge, no skin change and no tenderness.  Abdominal: Soft. She exhibits no distension and no mass. There is no tenderness.  Genitourinary:  Extremities anti-normal BUS-normal. Vagina-narrowed introitus; severe vaginal atrophy; digital exam allows 1 finger. Cervix-scarred; posterior cul-de-sac lost Uterus-small, midplane, mobile. Adnexa-nonpalpable and nontender. Rectovaginal-normal.  External exam; normal sphincter tone,; no rectal masses  Musculoskeletal: Normal range of motion. She exhibits no edema or tenderness.  Lymphadenopathy:    She has no cervical adenopathy.  Neurological: She is alert and oriented to person, place, and time.  Skin: Skin is warm and dry. No rash noted.  Psychiatric: She has a normal mood and affect. Her behavior is normal.   Right mastectomy changes noted without palpable mass, tenderness, skin change, edema  Assessment:   Annual gynecologic examination 65 y.o. Contraception: post menopausal status Normal BMI Patient Active Problem List   Diagnosis Date Noted  . 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 H/O malignant neoplasm of breast 10/01/2014  . MI (mitral  incompetence) 10/01/2014  . Osteopenia 10/01/2014  . Reactive depression (situational) 10/01/2014   Vaginal atrophy, severe, symptomatic. History of breast cancer 2009;NED  Plan:  Pap: Pap, Reflex if ASCUS Mammogram: Ordered Labs: primary care Routine preventative health maintenance measures emphasized: Diet/Weight control, Tobacco Cessation and Alcohol/Drug use Continue with calcium and vitamin D supplementation; continue weightbearing exercise. Vaginal lubricants,  Reviewed. Trial of Estrace cream, intravaginal 1/2 g twice a week Impact of estrogen cream, topical, in patient with history of breast cancer greater than 5 years post treatment was discussed and questions addressed Return to [place], [place]   Note: This dictation was prepared with Dragon dictation along with smaller phrase technology. Any transcriptional errors that result from this process are unintentional.

## 2015-07-07 NOTE — Patient Instructions (Signed)
1. Pap smear is performed. 2. Mammogram yearly; already done this year 3. Continue with calcium and vitamin D supplementation daily 1200 mg 4. Stool guaiac testing for colon cancer screening is deferred due to colonoscopy in 2016 5. Trial of Estrace cream 1/2 g intravaginal twice a week for severe atrophy 6. Lubricant recommendation: Astroglide JO H20 Lubricant 7. Return in 1 year

## 2015-07-11 LAB — PAP IG W/ RFLX HPV ASCU: PAP SMEAR COMMENT: 0

## 2015-10-01 ENCOUNTER — Inpatient Hospital Stay: Payer: Medicare Other | Admitting: Hematology and Oncology

## 2015-10-01 ENCOUNTER — Ambulatory Visit: Payer: Medicare Other | Admitting: Oncology

## 2015-10-01 NOTE — Progress Notes (Unsigned)
[facility] day:  10/01/2015  Chief Complaint: Kristie Valencia is a 65 y.o. female with stage I right breast cancer who is seen for reassessment.  HPI:  The patient presented with right breast cancer in 2009 with X.  She   She underwent right modified radical mastectomy by Dr. Jamal Collin.  Pathology revealed 5 mm invasive cancer, lobular in situ.  Two sentinel lymph nodes were negative.  Tumor was  ER+, PR+, and Her2/neu-.  Pathologic stage was T1N0M0.  carcinoma of breast, status post modified radical mastectomy.  Right breast, June of 2009.  She received hormonal therapy x 5 years.  Left breast stereotactic biopsy on 09/05/2011 revealed a small 1 mm focus of atypical lobular hyperplasia.    Patient last seen by Dr. Oliva Bustard on 10/01/2014.  At that time, she was doing well without evidence of recurrent disease.  Left sided mammogram on 06/12/2015 revealed no evidence of malignancy.  Past Medical History:  Diagnosis Date  . Allergic state   . Breast cancer ([place]) Right  . Cancer [facility]) 2009   Right mastectomy  . 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 Eczema   . Heart murmur   . Migraine   . Mitral regurgitation   . Osteopenia   . Situational depression     Past Surgical History:  Procedure Laterality Date  . BREAST BIOPSY Left   . BREAST BIOPSY Left 8/13   neg clip  . BREAST SURGERY Right    infiltrating ductal carcinoma  . COLONOSCOPY    . COLONOSCOPY WITH PROPOFOL N/A 09/29/2014   Procedure: COLONOSCOPY WITH PROPOFOL;  Surgeon: Manya Silvas, MD;  Location: [facility] ENDOSCOPY;  Service: Endoscopy;  Laterality: N/A;  . MASTECTOMY  07/02/07   rt breast  . TONSILLECTOMY    . TONSILLECTOMY      Family History  Problem Relation Age of Onset  . Heart disease Mother   . Breast cancer Mother 38    DX twice  . Cancer Father   . Diabetes Neg Hx   . Ovarian cancer Neg Hx     Social History:  reports that she has  never smoked. She has never used smokeless tobacco. She reports that she drinks about 0.6 oz of alcohol per week . She reports that she does not use drugs.  The patient is accompanied by *** alone today.  Allergies: No Known Allergies  Current Medications: Current Outpatient Prescriptions  Medication Sig Dispense Refill  . busPIRone (BUSPAR) 15 MG tablet Take by mouth.    . Cholecalciferol (VITAMIN D3) 2000 UNITS capsule Take by mouth.    . estradiol (ESTRACE) 0.1 MG/GM vaginal cream Apply 1/2 gram per vagina 2 times per week 30 g 12  . traZODone (DESYREL) 50 MG tablet Take by mouth.    . zolpidem (AMBIEN) 5 MG tablet Take 5 mg by mouth at bedtime as needed for sleep.     No current facility-administered medications for this visit.     Review of Systems:  GENERAL:  Feels good.  Active.  No fevers, sweats or weight loss. PERFORMANCE STATUS (ECOG):  *** HEENT:  No visual changes, runny nose, sore throat, mouth sores or tenderness. Lungs: No shortness of breath or cough.  No hemoptysis. Cardiac:  No chest pain, palpitations, orthopnea, or PND. GI:  No nausea, vomiting, diarrhea, constipation, melena or hematochezia. GU:  No urgency, frequency, dysuria, or hematuria. Musculoskeletal:  No back pain.  No joint pain.  No muscle tenderness. Extremities:  No pain or swelling. Skin:  No rashes or skin changes. Neuro:  No headache, numbness or weakness, balance or coordination issues. Endocrine:  No diabetes, thyroid issues, hot flashes or night sweats. Psych:  No mood changes, depression or anxiety. Pain:  No focal pain. Review of systems:  All other systems reviewed and found to be negative.   Physical Exam: There were no vitals taken for this visit. GENERAL:  Well developed, well nourished, sitting comfortably in the exam room in no acute distress. MENTAL STATUS:  Alert and oriented to person, place and time. HEAD:  *** hair.  Normocephalic, atraumatic, face symmetric, no Cushingoid  features. EYES:  *** eyes.  Pupils equal round and reactive to light and accomodation.  No conjunctivitis or scleral icterus. ENT:  Oropharynx clear without lesion.  Tongue normal. Mucous membranes moist.  RESPIRATORY:  Clear to auscultation without rales, wheezes or rhonchi. CARDIOVASCULAR:  Regular rate and rhythm without murmur, rub or gallop. ABDOMEN:  Soft, non-tender, with active bowel sounds, and no hepatosplenomegaly.  No masses. SKIN:  No rashes, ulcers or lesions. EXTREMITIES: No edema, no skin discoloration or tenderness.  No palpable cords. LYMPH NODES: No palpable cervical, supraclavicular, axillary or inguinal adenopathy  NEUROLOGICAL: Unremarkable. PSYCH:  Appropriate.  No visits with results within 3 Day(s) from this visit.  Latest known visit with results is:  Office Visit on 07/07/2015  Component Date Value Ref Range Status  . DIAGNOSIS: 07/11/2015 Comment   Final  . Specimen adequacy: 07/11/2015 Comment   Final   Comment: Satisfactory for evaluation. Endocervical and/or squamous metaplastic cells (endocervical component) are present.   Marland Kitchen CLINICIAN PROVIDED ICD10: 07/11/2015 Comment   Final  . Performed by: 07/11/2015 Comment   Final  . PAP SMEAR COMMENT 07/11/2015 .   Final  . Note: 07/11/2015 Comment   Final   Comment: The Pap smear is a screening test designed to aid in the detection of premalignant and malignant conditions of the uterine cervix.  It is not a diagnostic procedure and should not be used as the sole means of detecting cervical cancer.  Both false-positive and false-negative reports do occur.   . Test Methodology 07/11/2015 Comment   Final   Comment: This liquid based ThinPrep(R) pap test was screened with the use of an image guided system.   Marland Kitchen PAP REFLEX: 07/11/2015 Comment   Final   Comment: The HPV DNA reflex criteria were not met with this specimen result therefore, no HPV testing was performed.     Assessment:  Kristie Valencia is a 65  y.o. female ***  Plan: 1. *** 2. *** 3. *** 4. *** 5. ***  Kristie Asal, MD  10/01/2015, 4:01 AM

## 2016-04-29 ENCOUNTER — Other Ambulatory Visit: Payer: Self-pay | Admitting: Internal Medicine

## 2016-04-29 DIAGNOSIS — Z1231 Encounter for screening mammogram for malignant neoplasm of breast: Secondary | ICD-10-CM

## 2016-06-29 ENCOUNTER — Ambulatory Visit
Admission: RE | Admit: 2016-06-29 | Discharge: 2016-06-29 | Disposition: A | Discharge status: Home / Self Care | Payer: 59 | Source: Ambulatory Visit | Attending: Internal Medicine | Admitting: Internal Medicine

## 2016-06-29 DIAGNOSIS — Z1231 Encounter for screening mammogram for malignant neoplasm of breast: Secondary | ICD-10-CM | POA: Diagnosis not present

## 2016-06-29 LAB — HM MAMMOGRAPHY: HM MAMMO: NORMAL (ref 0–4)

## 2016-07-04 NOTE — Progress Notes (Deleted)
GYN ANNUAL PREVENTATIVE CARE ENCOUNTER NOTE  Subjective:       Kristie Valencia is a 66 y.o. G2P2 female here for a routine annual gynecologic exam.  Current complaints: Severe vaginal atrophy   Significant health history includes breast cancer diagnosed in 2009 for which she is status post modified right radical mastectomy; no chemotherapy or radiation therapy was given.  Patient is currently Showing no evidence of disease.  Patient does exercise regularly; she maintains a normal body mass index; she is with a diagnosis of osteopenia.  She is taking calcium and vitamin D supplementation.    Gynecologic History No LMP recorded. Patient is postmenopausal. Contraception: post menopausal status  Last pap- 2017- neg Mammo- 05/2016 birad 1   Obstetric History OB History  Gravida Para Term Preterm AB Living  2 2       2   SAB TAB Ectopic Multiple Live Births               # Outcome Date GA Lbr Len/2nd Weight Sex Delivery Anes PTL Lv  2 Para           1 Para             Obstetric Comments  1st Menstrual Cycle:  11  1st Pregnancy:  55    Past Medical History:  Diagnosis Date  . Allergic state   . Breast cancer ([place]) 2009   RT MASTECTOMY  . 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 Eczema   . Heart murmur   . Migraine   . Mitral regurgitation   . Osteopenia   . Situational depression     Past Surgical History:  Procedure Laterality Date  . BREAST BIOPSY Left 8/13   NEG  . BREAST EXCISIONAL BIOPSY Left 1970'S   NEG  . BREAST SURGERY Right    infiltrating ductal carcinoma  . COLONOSCOPY    . COLONOSCOPY WITH PROPOFOL N/A 09/29/2014   Procedure: COLONOSCOPY WITH PROPOFOL;  Surgeon: Manya Silvas, MD;  Location: [facility] ENDOSCOPY;  Service: Endoscopy;  Laterality: N/A;  . MASTECTOMY Right 07/02/07   BREAST CA  . TONSILLECTOMY    . TONSILLECTOMY      Current Outpatient Prescriptions on File Prior to Visit  Medication Sig Dispense Refill  .  Cholecalciferol (VITAMIN D3) 2000 UNITS capsule Take by mouth.    . estradiol (ESTRACE) 0.1 MG/GM vaginal cream Apply 1/2 gram per vagina 2 times per week 30 g 12  . traZODone (DESYREL) 50 MG tablet Take by mouth.    . zolpidem (AMBIEN) 5 MG tablet Take 5 mg by mouth at bedtime as needed for sleep.     No current facility-administered medications on file prior to visit.     No Known Allergies  Social History   Social History  . Marital status: Married    Spouse name: N/A  . Number of children: N/A  . Years of education: N/A   Occupational History  . Not on file.   Social History Main Topics  . Smoking status: Never Smoker  . Smokeless tobacco: Never Used  . Alcohol use 0.6 oz/week    1 Glasses of wine per week     Comment: rare  . Drug use: No  . Sexual activity: Yes    Birth control/ protection: Post-menopausal   Other Topics Concern  . Not on file   Social History Narrative  . No narrative on file    Family History  Problem Relation Age of Onset  .  Heart disease Mother   . Breast cancer Mother 36       DX twice  . Cancer Father   . Diabetes Neg Hx   . Ovarian cancer Neg Hx     The following portions of the patient's history were reviewed and updated as appropriate: allergies, current medications, past family history, past medical history, past social history, past surgical history and problem list.  Review of Systems Review of Systems  Constitutional: Negative for chills, fever and weight loss.  HENT: Negative.   Respiratory: Negative.   Cardiovascular: Negative.   Gastrointestinal: Negative for abdominal pain, blood in stool, constipation and diarrhea.  Genitourinary: Negative for dysuria, frequency and urgency.  Musculoskeletal: Negative.   Skin: Negative for itching and rash.  Neurological: Negative.  Negative for weakness.  Endo/Heme/Allergies: Negative.   Psychiatric/Behavioral: Negative.      Objective:   There were no vitals taken for this  visit. Physical Exam  Constitutional: She is oriented to person, place, and time. She appears well-developed and well-nourished.  HENT:  Head: Atraumatic.  Eyes: Conjunctivae and EOM are normal.  Neck: Normal range of motion. Neck supple. No thyromegaly present.  Cardiovascular: Normal rate and regular rhythm.   No murmur heard. Pulmonary/Chest: Effort normal and breath sounds normal. She exhibits no mass, no tenderness, no edema and no swelling. Right breast exhibits no mass, no skin change and no tenderness. Left breast exhibits no mass, no nipple discharge, no skin change and no tenderness.  Abdominal: Soft. She exhibits no distension and no mass. There is no tenderness.  Genitourinary:  Genitourinary Comments: Extremities anti-normal BUS-normal. Vagina-narrowed introitus; severe vaginal atrophy; digital exam allows 1 finger. Cervix-scarred; posterior cul-de-sac lost Uterus-small, midplane, mobile. Adnexa-nonpalpable and nontender. Rectovaginal-normal.  External exam; normal sphincter tone,; no rectal masses  Musculoskeletal: Normal range of motion. She exhibits no edema or tenderness.  Lymphadenopathy:    She has no cervical adenopathy.  Neurological: She is alert and oriented to person, place, and time.  Skin: Skin is warm and dry. No rash noted.  Psychiatric: She has a normal mood and affect. Her behavior is normal.   Right mastectomy changes noted without palpable mass, tenderness, skin change, edema  Assessment:   Annual gynecologic examination 66 y.o. Contraception: post menopausal status Normal BMI Patient Active Problem List   Diagnosis Date Noted  . Vaginal atrophy 07/07/2015  . 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 H/O malignant neoplasm of breast 10/01/2014  . MI (mitral incompetence) 10/01/2014  . Osteopenia 10/01/2014  . Reactive depression (situational) 10/01/2014  . Breast cancer, right ([place]) 07/02/2007   Vaginal atrophy, severe,  symptomatic. History of breast cancer 2009;NED  Plan:  Pap: not needed Mammogram:utd Labs: primary care Routine preventative health maintenance measures emphasized: Diet/Weight control, Tobacco Cessation and Alcohol/Drug use Continue with calcium and vitamin D supplementation; continue weightbearing exercise. Vaginal lubricants,  Reviewed. Trial of Estrace cream, intravaginal 1/2 g twice a week Impact of estrogen cream, topical, in patient with history of breast cancer greater than 5 years post treatment was discussed and questions addressed Return to [place], [place]   Note: This dictation was prepared with Dragon dictation along with smaller phrase technology. Any transcriptional errors that result from this process are unintentional.

## 2016-07-07 ENCOUNTER — Encounter: Payer: Managed Care, Other (non HMO) | Admitting: Obstetrics and Gynecology

## 2016-07-14 ENCOUNTER — Encounter: Payer: Self-pay | Admitting: Certified Nurse Midwife

## 2016-07-14 ENCOUNTER — Ambulatory Visit (INDEPENDENT_AMBULATORY_CARE_PROVIDER_SITE_OTHER): Payer: 59 | Admitting: Certified Nurse Midwife

## 2016-07-14 VITALS — BP 114/66 | HR 74 | Ht 65.0 in | Wt 132.0 lb

## 2016-07-14 DIAGNOSIS — Z853 Personal history of malignant neoplasm of breast: Secondary | ICD-10-CM | POA: Diagnosis not present

## 2016-07-14 DIAGNOSIS — N952 Postmenopausal atrophic vaginitis: Secondary | ICD-10-CM | POA: Diagnosis not present

## 2016-07-14 DIAGNOSIS — Z01419 Encounter for gynecological examination (general) (routine) without abnormal findings: Secondary | ICD-10-CM | POA: Diagnosis not present

## 2016-07-14 DIAGNOSIS — Z124 Encounter for screening for malignant neoplasm of cervix: Secondary | ICD-10-CM | POA: Diagnosis not present

## 2016-07-18 ENCOUNTER — Encounter: Payer: Self-pay | Admitting: Certified Nurse Midwife

## 2016-07-18 MED ORDER — PRASTERONE 6.5 MG VA INST: 6.5000 mg | VAGINAL_INSERT | Freq: Every day | VAGINAL | 2 refills | Status: AC

## 2016-07-18 NOTE — Progress Notes (Signed)
Gynecology Annual Exam  PCP: Adin Hector, MD  Chief Complaint:  Chief Complaint  Patient presents with  . Gynecologic Exam    History of Present Illness: Kristie Valencia is a 66 y.o.  White female [id number] who presents for a NP  annual exam. The patient has no complaints today. She has been menopausal since age 10. Has dyspareunia due to vaginal atrophy. Has tried lubricants with little relief. Was prescribed estrogen vaginal cream last year. But unsure if she used this medicine. Is interested in non estrogen options. No spotting or vaginal bleeding. Last pap smear:07/07/2015, results were NIL. No history of abnormal Pap smears.   Her past medical history is remarkable for right invasive breast cancer/ right radical mastectomy in 2009. Received hormonal therapy x 5 years.    The patient does not perform self breast exams. Her last left mammogram was 06/29/16, results were negative.   There is a family history of breast cancer in her mother at age 25 and recurrence at age 23 Genetic testing has been done. The patient tested negative for BRCA. There is no family history of ovarian cancer. There is a family history of colon cancer in her father and a paternal cousin. She had her last colonoscopy 09/29/2014 and it was remarkable for an adenomatous polyp. She is due for another colonoscopy next year (every 3 years).  She has a history of osteopenia and is scheduled for a DEXA scan on 19 July 2016.   The patient denies smoking.  She denies drinking alcohol.  She denies illegal drug use.  The patient reports exercising regularly.  The patient denies current symptoms of depression.    Review of Systems: Review of Systems  Constitutional: Negative for chills, fever and weight loss.  HENT: Negative for congestion, sinus pain and sore throat.   Eyes: Negative for blurred vision and pain.  Respiratory: Negative for hemoptysis, shortness of breath and wheezing.   Cardiovascular:  Negative for chest pain, palpitations and leg swelling.  Gastrointestinal: Negative for abdominal pain, blood in stool, diarrhea, heartburn, nausea and vomiting.  Genitourinary: Negative for dysuria, frequency, hematuria and urgency.  Musculoskeletal: Negative for back pain, joint pain and myalgias.  Skin: Negative for itching and rash.  Neurological: Negative for dizziness, tingling and headaches.  Endo/Heme/Allergies: Negative for environmental allergies and polydipsia. Does not bruise/bleed easily.       Negative for hirsutism   Psychiatric/Behavioral: Negative for depression. The patient is not nervous/anxious and does not have insomnia.     Past Medical History:  Past Medical History:  Diagnosis Date  . Allergic state   . Breast cancer ([place]) 2009   RT MASTECTOMY  . 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 Eczema   . Heart murmur   . Migraine   . Mitral regurgitation   . Osteopenia   . Situational depression     Past Surgical History:  Past Surgical History:  Procedure Laterality Date  . BREAST BIOPSY Left 8/13   NEG  . BREAST EXCISIONAL BIOPSY Left 1970'S   NEG  . BREAST SURGERY Right    infiltrating ductal carcinoma  . COLONOSCOPY  2013  . COLONOSCOPY WITH PROPOFOL N/A 09/29/2014   Procedure: COLONOSCOPY WITH PROPOFOL;  Surgeon: Manya Silvas, MD;  Location: [facility] ENDOSCOPY;  Service: Endoscopy;  Laterality: N/A;  . MASTECTOMY Right 07/02/07   BREAST CA  . TONSILLECTOMY      Family History:  Family History  Problem Relation Age  of Onset  . Heart disease Mother   . Breast cancer Mother 16       DX twice  . Cancer Father   . Diabetes Neg Hx   . Ovarian cancer Neg Hx     Social History:  Social History   Social History  . Marital status: Married    Spouse name: N/A  . Number of children: N/A  . Years of education: N/A   Occupational History  . Not on file.   Social History Main Topics  . Smoking status: Never Smoker  . Smokeless  tobacco: Never Used  . Alcohol use 0.6 oz/week    1 Glasses of wine per week     Comment: rare  . Drug use: No  . Sexual activity: Yes    Birth control/ protection: Post-menopausal   Other Topics Concern  . Not on file   Social History Narrative  . No narrative on file    Allergies:  No Known Allergies  Medications: Prior to Admission medications   Medication Sig Start Date End Date Taking? Authorizing Provider  Absorbable Collagen Hemostat (ACTIFOAM COLLAGEN SPONGE) MISC Use. 1 tablespoonful every am   Yes [provider]  Cholecalciferol (VITAMIN D3) 2000 UNITS capsule Take by mouth.   Yes [provider]  Cholecalciferol (VITAMIN D3) 2000 units capsule Take by mouth.    [provider]    Physical Exam Vital Signs: BP 114/66 (BP Location: Left Arm, Patient Position: Sitting, Cuff Size: Normal)   Pulse 74   Ht '5\' 5"'$  (1.651 m)   Wt 132 lb (59.9 kg)   BMI 21.97 kg/m   General: pleasant WF in NAD HEENT: normocephalic, anicteric Neck: no thyroid enlargement, no palpable nodules, no cervical lymphadenopathy  Pulmonary: No increased work of breathing, CTAB Cardiovascular: RRR, without murmur  Breast: s/p right mastectomy. Left breast:  no tenderness, no palpable nodules or masses, no skin or nipple retraction present, no nipple discharge.  No axillary, infraclavicular or supraclavicular lymphadenopathy bilaterally. Abdomen: Soft, non-tender, non-distended.  Umbilicus without lesions.  No hepatomegaly or masses palpable. No evidence of hernia. Genitourinary:  External: Atrophic changes. Normal urethral meatus, normal Bartholin's and Skene's glands.    Vagina: pale flattened rugae,  no evidence of prolapse.    Cervix: stenotic, no bleeding, non-tender  Uterus: Midplane, small, mobile, and non-tender  Adnexa: No adnexal masses, non-tender  Rectal: deferred  Lymphatic: no evidence of inguinal lymphadenopathy Extremities: no edema, erythema, or  tenderness Neurologic: Grossly intact Psychiatric: mood appropriate, affect full     Assessment: 66 y.o. [id number] well woman gyn exam Dyspareunia due to vaginal atrophy Hx of right breast cancer and right radical mastectomy  Plan:   1) Breast cancer screening - recommend monthly self breast exam and annual mamograms Mammogram is up to date.  2) Discussed Intrarosa for treatment of vulvovaginal atrophy. She desires to try this RX. Given instructions to use nightly. FU in 6 weeks  3) Cervical cancer screening - Pap was done. ASCCP guidelines and rational discussed.  Patient opts for yearly or every other year screening interval (To check with medicaid supplement to see what they will allow.  4.) Routine healthcare maintenance including cholesterol and diabetes screening managed by PCP   5) Colonoscopy UTD-due next year.  6) DEXA scan scheduled for next week.  Dalia Heading, CNM

## 2016-07-20 LAB — IGP,RFX APTIMA HPV ALL PTH: PAP Smear Comment: 0

## 2016-09-21 ENCOUNTER — Encounter: Payer: Self-pay | Admitting: Certified Nurse Midwife

## 2017-05-22 ENCOUNTER — Other Ambulatory Visit: Payer: Self-pay | Admitting: Certified Nurse Midwife

## 2017-05-22 DIAGNOSIS — Z1231 Encounter for screening mammogram for malignant neoplasm of breast: Secondary | ICD-10-CM

## 2017-06-30 ENCOUNTER — Ambulatory Visit
Admission: RE | Admit: 2017-06-30 | Discharge: 2017-06-30 | Disposition: A | Discharge status: Home / Self Care | Payer: 59 | Source: Ambulatory Visit | Attending: Certified Nurse Midwife | Admitting: Certified Nurse Midwife

## 2017-06-30 DIAGNOSIS — Z1231 Encounter for screening mammogram for malignant neoplasm of breast: Secondary | ICD-10-CM | POA: Diagnosis not present

## 2018-05-30 ENCOUNTER — Other Ambulatory Visit: Payer: Self-pay | Admitting: Certified Nurse Midwife

## 2018-07-13 ENCOUNTER — Other Ambulatory Visit: Payer: Self-pay | Admitting: Internal Medicine

## 2018-07-13 DIAGNOSIS — Z1231 Encounter for screening mammogram for malignant neoplasm of breast: Secondary | ICD-10-CM

## 2018-07-20 ENCOUNTER — Ambulatory Visit
Admission: RE | Admit: 2018-07-20 | Discharge: 2018-07-20 | Disposition: A | Discharge status: Home / Self Care | Payer: 59 | Source: Ambulatory Visit | Attending: Internal Medicine | Admitting: Internal Medicine

## 2018-07-20 ENCOUNTER — Other Ambulatory Visit: Payer: Self-pay

## 2018-07-20 DIAGNOSIS — Z1231 Encounter for screening mammogram for malignant neoplasm of breast: Secondary | ICD-10-CM | POA: Insufficient documentation

## 2019-06-17 ENCOUNTER — Other Ambulatory Visit: Payer: Self-pay | Admitting: Internal Medicine

## 2019-06-17 DIAGNOSIS — Z1231 Encounter for screening mammogram for malignant neoplasm of breast: Secondary | ICD-10-CM

## 2019-07-22 ENCOUNTER — Ambulatory Visit
Admission: RE | Admit: 2019-07-22 | Discharge: 2019-07-22 | Disposition: A | Discharge status: Home / Self Care | Payer: 59 | Source: Ambulatory Visit | Attending: Internal Medicine | Admitting: Internal Medicine

## 2019-07-22 DIAGNOSIS — Z1231 Encounter for screening mammogram for malignant neoplasm of breast: Secondary | ICD-10-CM

## 2020-08-26 ENCOUNTER — Other Ambulatory Visit: Payer: Self-pay | Admitting: Internal Medicine

## 2020-08-26 DIAGNOSIS — Z1231 Encounter for screening mammogram for malignant neoplasm of breast: Secondary | ICD-10-CM

## 2020-09-04 ENCOUNTER — Ambulatory Visit
Admission: RE | Admit: 2020-09-04 | Discharge: 2020-09-04 | Disposition: A | Discharge status: Home / Self Care | Payer: 59 | Source: Ambulatory Visit | Attending: Internal Medicine | Admitting: Internal Medicine

## 2020-09-04 ENCOUNTER — Other Ambulatory Visit: Payer: Self-pay

## 2020-09-04 DIAGNOSIS — Z1231 Encounter for screening mammogram for malignant neoplasm of breast: Secondary | ICD-10-CM | POA: Insufficient documentation

## 2021-01-29 ENCOUNTER — Emergency Department
Admission: EM | Admit: 2021-01-29 | Discharge: 2021-01-29 | Disposition: A | Discharge status: Home / Self Care | Payer: 59 | Attending: Emergency Medicine | Admitting: Emergency Medicine

## 2021-01-29 ENCOUNTER — Other Ambulatory Visit: Payer: Self-pay

## 2021-01-29 ENCOUNTER — Emergency Department: Payer: 59

## 2021-01-29 DIAGNOSIS — M542 Cervicalgia: Secondary | ICD-10-CM | POA: Insufficient documentation

## 2021-01-29 DIAGNOSIS — R519 Headache, unspecified: Secondary | ICD-10-CM | POA: Diagnosis not present

## 2021-01-29 DIAGNOSIS — Z79899 Other long term (current) drug therapy: Secondary | ICD-10-CM | POA: Diagnosis not present

## 2021-01-29 DIAGNOSIS — Z853 Personal history of malignant neoplasm of breast: Secondary | ICD-10-CM | POA: Insufficient documentation

## 2021-01-29 LAB — BASIC METABOLIC PANEL
Anion gap: 6 (ref 5–15)
BUN: 26 mg/dL — ABNORMAL HIGH (ref 8–23)
CO2: 31 mmol/L (ref 22–32)
Calcium: 9.4 mg/dL (ref 8.9–10.3)
Chloride: 102 mmol/L (ref 98–111)
Creatinine, Ser: 0.86 mg/dL (ref 0.44–1.00)
GFR, Estimated: >60 mL/min (ref >60)
Glucose, Bld: 91 mg/dL (ref 70–99)
Potassium: 4.5 mmol/L (ref 3.5–5.1)
Sodium: 139 mmol/L (ref 135–145)

## 2021-01-29 LAB — CBC
HCT: 42.4 % (ref 36.0–46.0)
Hemoglobin: 14.3 g/dL (ref 12.0–15.0)
MCH: 32 pg (ref 26.0–34.0)
MCHC: 33.7 g/dL (ref 30.0–36.0)
MCV: 94.9 fL (ref 80.0–100.0)
Platelets: 245 K/uL (ref 150–400)
RBC: 4.47 MIL/uL (ref 3.87–5.11)
RDW: 11.9 % (ref 11.5–15.5)
WBC: 4.5 K/uL (ref 4.0–10.5)
nRBC: 0 % (ref 0.0–0.2)

## 2021-01-29 LAB — TROPONIN I (HIGH SENSITIVITY)
Troponin I (High Sensitivity): 4 ng/L
Troponin I (High Sensitivity): 5 ng/L (ref <18)

## 2021-01-29 MED ORDER — PREDNISONE 50 MG PO TABS: 50.0000 mg | ORAL_TABLET | Freq: Every day | ORAL | 0 refills | Status: AC

## 2021-01-29 MED ORDER — PREDNISONE 20 MG PO TABS
60.0000 mg | ORAL_TABLET | Freq: Once | ORAL | Status: AC
  Administered 2021-01-29: 19:00:00 60 mg via ORAL
  Filled 2021-01-29: qty 3

## 2021-01-29 NOTE — ED Triage Notes (Signed)
Pt c/o headache x 5-6 days and pain in left neck/arm/shoulder x 3-4 days.  Denies injury.  Pt reports taking several prescription medications w/o relief.    Slight droop in L side of mouth noted.

## 2021-01-29 NOTE — ED Provider Notes (Signed)
[facility] Emergency Department Provider Note   ____________________________________________   Event Date/Time   First MD Initiated Contact with Patient 01/29/21 1837     (approximate)  I have reviewed the triage vital signs and the nursing notes.   HISTORY  Chief Complaint Headache and Shoulder Pain    HPI Kristie Valencia is a 70 y.o. female who presents for left-sided neck pain and headache  LOCATION: Left neck and head DURATION: 5-6 days prior to arrival TIMING: Stable since onset SEVERITY: Severe QUALITY: Sharp stabbing pain CONTEXT: Patient states that she has a history of migraines but has not had one in years and began having a left-sided headache.  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   Eczema    Heart murmur    Migraine    Mitral regurgitation    Osteopenia    Situational depression     Patient Active Problem List   Diagnosis Date Noted   Vaginal atrophy 07/07/2015   DDD (degenerative disc disease), 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   H/O malignant neoplasm of breast 10/01/2014   MI (mitral incompetence) 10/01/2014   Osteopenia 10/01/2014   Reactive depression (situational) 10/01/2014   Breast cancer, right ([place]) 07/02/2007    Past Surgical History:  Procedure Laterality Date   BREAST BIOPSY Left 8/13   NEG   BREAST EXCISIONAL BIOPSY Left 1970'S   NEG   COLONOSCOPY  2013   COLONOSCOPY WITH PROPOFOL N/A 09/29/2014    Procedure: COLONOSCOPY WITH PROPOFOL;  Surgeon: Manya Silvas, MD;  Location: [facility];  Service: Endoscopy;  Laterality: N/A;   DILATION AND CURETTAGE, DIAGNOSTIC / THERAPEUTIC  1999   AUB/ Dr Enzo Bi   MASTECTOMY Right 07/02/2007   infiltrating ductal carcinoma   TONSILLECTOMY      Prior to Admission medications   Medication Sig Start Date End Date Taking? Authorizing Provider  predniSONE (DELTASONE) 50 MG tablet Take 1 tablet (50 mg total) by mouth daily with breakfast for 5 days. 01/29/21 02/03/21 Yes Emmarose Klinke, Vista Lawman, MD  Absorbable Collagen Hemostat (ACTIFOAM COLLAGEN SPONGE) MISC Use. 1 tablespoonful every am    [provider]  b complex vitamins capsule Take 1 capsule by mouth daily.    [provider]  Cholecalciferol (VITAMIN D3) 2000 UNITS capsule Take by mouth.    [provider]  Prasterone (INTRAROSA) 6.5 MG INST Place 6.5 mg vaginally at bedtime. 07/18/16   Dalia Heading, CNM    Allergies Patient has no known allergies.  Family History  Problem Relation Age of Onset   Heart disease Mother    Breast cancer Mother 92       recurrence at 66   Hypertension Mother    Thyroid disease Mother    Diabetes Mother    Lung cancer Mother        died at 45 from lung cancer   Cancer Father    Endometriosis Sister  both sisters   Colon cancer Cousin    Ovarian cancer Neg Hx     Social History Social History   Tobacco Use   Smoking status: Never   Smokeless tobacco: Never  Vaping Use   Vaping Use: Never used  Substance Use Topics   Alcohol use: Yes    Alcohol/week: 1.0 standard drink    Types: 1 Glasses of wine per week    Comment: rare   Drug use: No    Review of Systems Constitutional: No fever/chills Eyes: No visual changes. ENT: No sore throat. Cardiovascular: Denies chest pain. Respiratory: Denies shortness of breath. Gastrointestinal: No abdominal pain.  No nausea, no vomiting.  No diarrhea. Genitourinary:  Negative for dysuria. Musculoskeletal: Endorses left-sided cervical paraspinal muscular tenderness and pain Skin: Negative for rash. Neurological: Positive for headaches, negative for weakness/numbness/paresthesias in any extremity Psychiatric: Negative for suicidal ideation/homicidal ideation   ____________________________________________   PHYSICAL EXAM:  VITAL SIGNS: ED Triage Vitals  Enc Vitals Group     BP 01/29/21 1515 107/63     Pulse Rate 01/29/21 1515 (!) 55     Resp 01/29/21 1515 16     Temp 01/29/21 1515 98.2 F (36.8 C)     Temp Source 01/29/21 1515 Oral     SpO2 01/29/21 1515 94 %     Weight 01/29/21 1358 132 lb (59.9 kg)     Height 01/29/21 1358 5\' 5"  (1.651 m)     Head Circumference --      Peak Flow --      Pain Score 01/29/21 1358 10     Pain Loc --      Pain Edu? --      Excl. in [place]? --    Constitutional: Alert and oriented. Well appearing and in no acute distress. Eyes: Conjunctivae are normal. PERRL. Head: Atraumatic. Nose: No congestion/rhinnorhea. Mouth/Throat: Mucous membranes are moist. Neck: No stridor Cardiovascular: Grossly normal heart sounds.  Good peripheral circulation. Respiratory: Normal respiratory effort.  No retractions. Gastrointestinal: Soft and nontender. No distention. Musculoskeletal: Tenderness to palpation over the left cervical paraspinal musculature with negative Spurling test Neurologic:  Normal speech and language. No gross focal neurologic deficits are appreciated. Skin:  Skin is warm and dry. No rash noted. Psychiatric: Mood and affect are normal. Speech and behavior are normal. ____________________________________________ LABS (all labs ordered are listed, but only abnormal results are displayed)  Labs Reviewed  BASIC METABOLIC PANEL - Abnormal; Notable for the following components:      Result Value   BUN 26 (*)    All other components within normal limits  CBC  URINALYSIS, ROUTINE W REFLEX MICROSCOPIC  TROPONIN  I (HIGH SENSITIVITY)  TROPONIN I (HIGH SENSITIVITY)  RADIOLOGY  ED MD interpretation: CT of the head without contrast shows no evidence of acute abnormalities including no intracerebral hemorrhage, obvious masses, or significant edema Official radiology report(s): CT Head Wo Contrast  Result Date: 01/29/2021 CLINICAL DATA:  Facial paralysis and weakness. EXAM: CT HEAD WITHOUT CONTRAST TECHNIQUE: Contiguous axial images were obtained from the base of the skull through the vertex without intravenous contrast. COMPARISON:  None. FINDINGS: Brain: No evidence of acute infarction, hemorrhage, hydrocephalus, extra-axial collection or mass lesion/mass effect. Vascular: No hyperdense vessel or unexpected calcification. Skull: Normal. Negative for fracture or focal lesion. Sinuses/Orbits: No acute finding. Other: None. IMPRESSION: No evidence for acute intracranial abnormality. Electronically Signed   By: Nolon Nations M.D.   On: 01/29/2021 15:01    ____________________________________________   PROCEDURES  Procedure(s) performed (including Critical Care):  Procedures   ____________________________________________   INITIAL IMPRESSION / ASSESSMENT AND PLAN / ED COURSE  As part of my medical decision making, I reviewed the following data within the electronic medical record, if available:  Nursing notes reviewed and incorporated, Labs reviewed, EKG interpreted, Old chart reviewed, Radiograph reviewed and Notes from prior ED visits reviewed and incorporated        + neck pain  ED Workup: Defer C-Spine imaging given negative by NEXUS criteria CT of the head does not show any evidence of acute abnormalities Given History, Exam the patient appears to have a cervical radiculopathy.   Patient appears to be low risk for complications or other emergent conditions such as  anginal equivalent, frank cervical instability, arterial dissection, osteomyelitis, epidural abscess, central cord syndrome,  c-spine fracture, other spinal emergencies  Rx: NSAIDs, 5-day course of prednisone, outpatient recommendation for home exercises in the interim Disposition: Discharge. The patient has been given strict return precautions and understands the need to follow up within 48 hours with their primary care provider      ____________________________________________   FINAL CLINICAL IMPRESSION(S) / ED DIAGNOSES  Final diagnoses:  Bad headache  Neck pain on left side     ED Discharge Orders          Ordered    predniSONE (DELTASONE) 50 MG tablet  Daily with breakfast        01/29/21 1859             Note:  This document was prepared using Dragon voice recognition software and may include unintentional dictation errors.    Naaman Plummer, MD 01/29/21 [id number]

## 2021-01-29 NOTE — Discharge Instructions (Addendum)
You may continue to use any previous prescribed medications for this headache and neck pain.  You may also use anti-inflammatories including ibuprofen (up to 800 mg every 8 hours), Tylenol (up to 4 g in a 24-hour period), and/or naproxen (up to 500 mg every 12 hours) as needed for continued pain

## 2021-01-29 NOTE — ED Provider Notes (Signed)
Emergency Medicine Provider Triage Evaluation Note  Kristie Valencia , a 70 y.o. female  was evaluated in triage.  Pt complains of  left neck/arm/shoulder pain for the past 5 days and headache for the past 3 days. No relief with tizanidine and imitrex. .  Review of Systems  Positive: Headache,  and left side neck pain Negative: Chest pain  Physical Exam  There were no vitals taken for this visit. Gen:   Awake, no distress   Resp:  Normal effort  MSK:   Moves extremities without difficulty  Other:    Medical Decision Making  Medically screening exam initiated at 1:56 PM.  Appropriate orders placed.  Kristie Valencia was informed that the remainder of the evaluation will be completed by another provider, this initial triage assessment does not replace that evaluation, and the importance of remaining in the ED until their evaluation is complete.   Victorino Dike, FNP 01/29/21 1409    Harvest Dark, MD 01/29/21 1506

## 2021-06-05 IMAGING — MG DIGITAL SCREENING UNILAT LEFT W/ TOMO W/ CAD
6 series · 6 of 18 positions shown · non-contrast
Comparison: Previous exam(s).

CLINICAL DATA: Screening.

EXAM:
DIGITAL SCREENING UNILATERAL LEFT MAMMOGRAM WITH CAD AND TOMO

[L CC synth-2D]
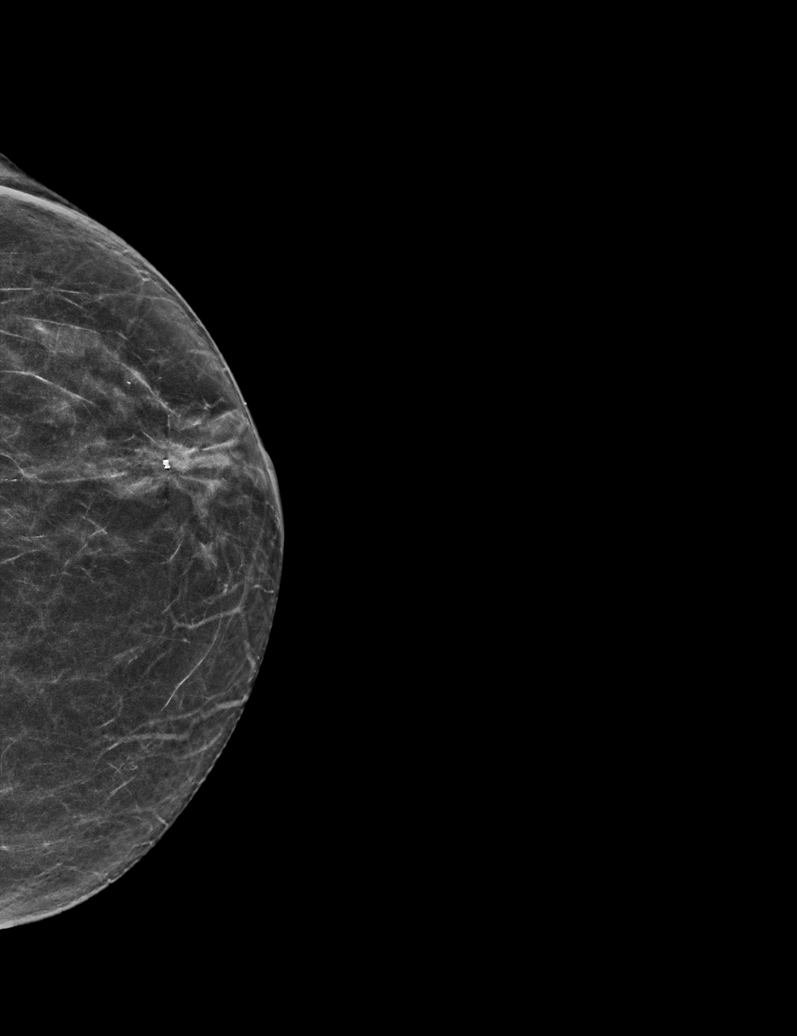

[L MLO synth-2D (1 of 2)]
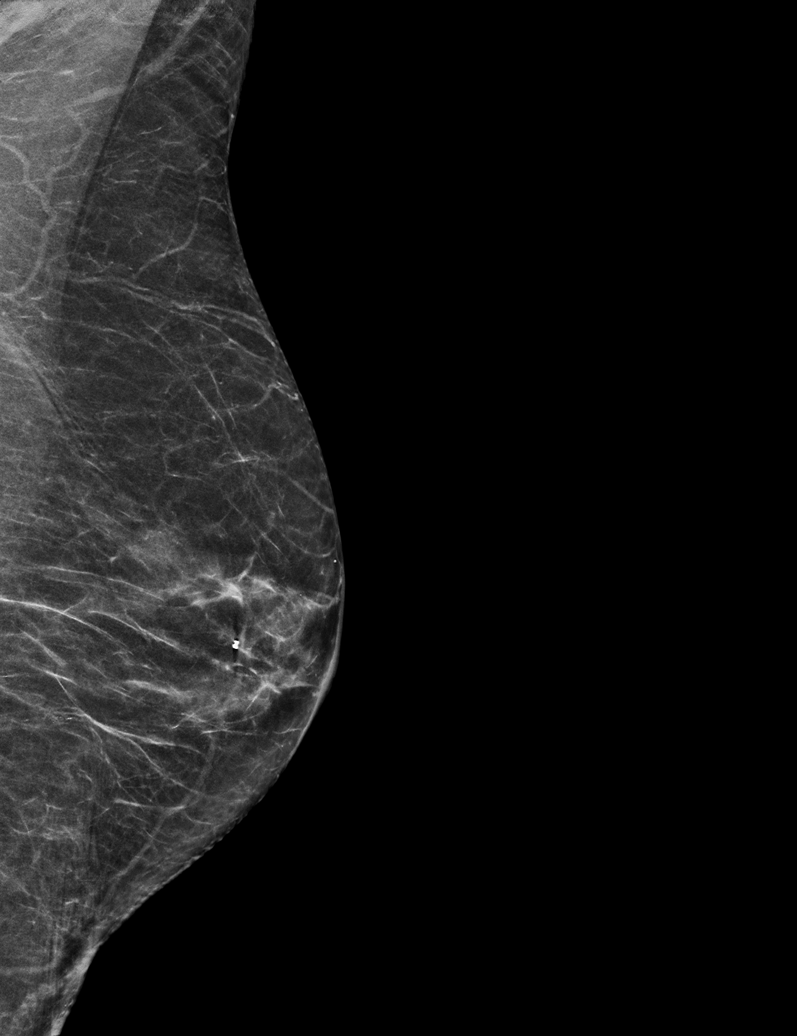

[L MLO synth-2D (2 of 2)]
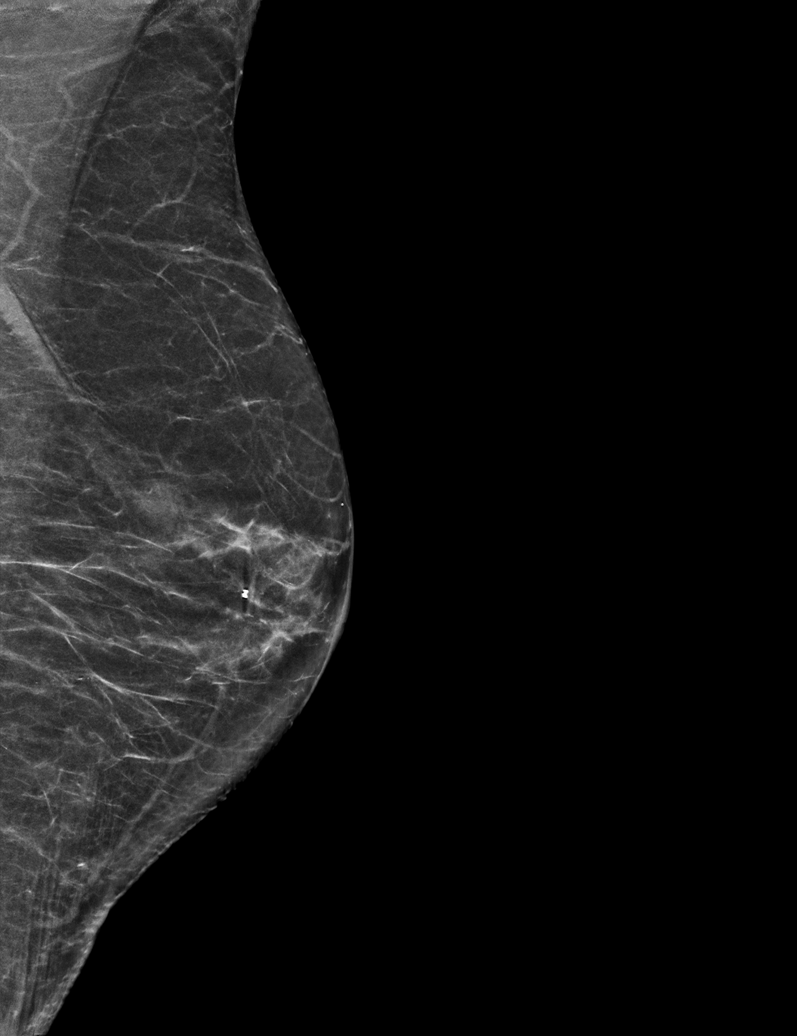

[L MLO tomo (1 of 2) · tomo slice 25/50.0]
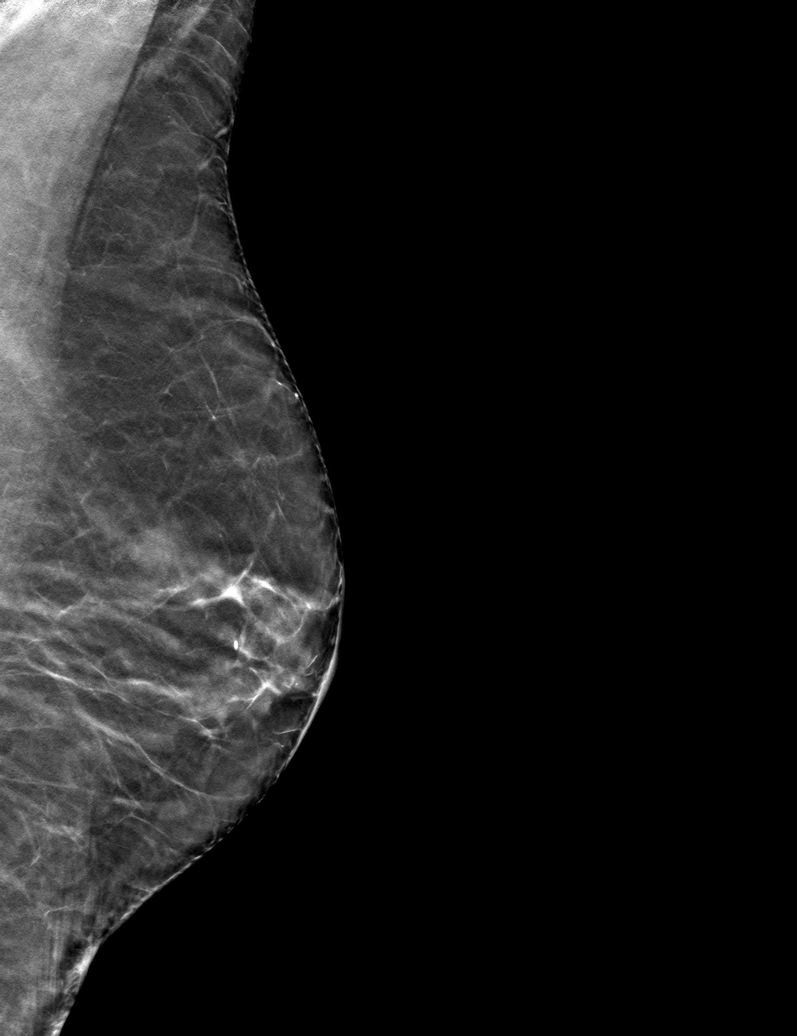

[L MLO tomo (2 of 2) · tomo slice 29/56.0]
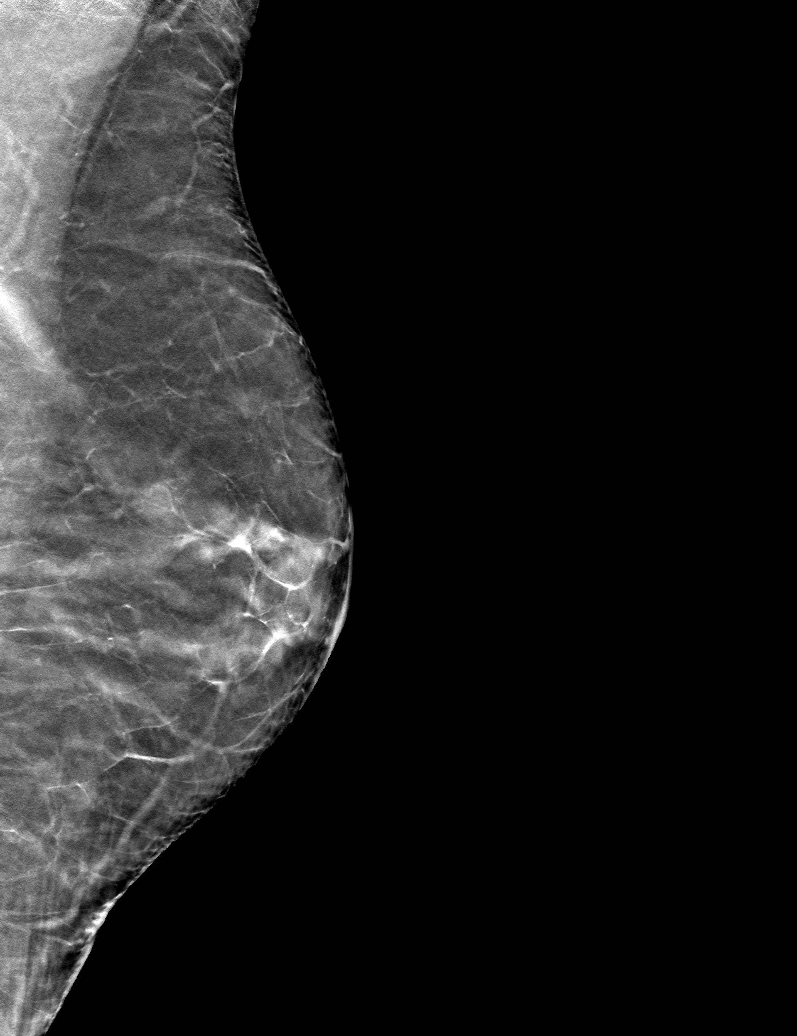

[L CC tomo · tomo slice 27/52.0]
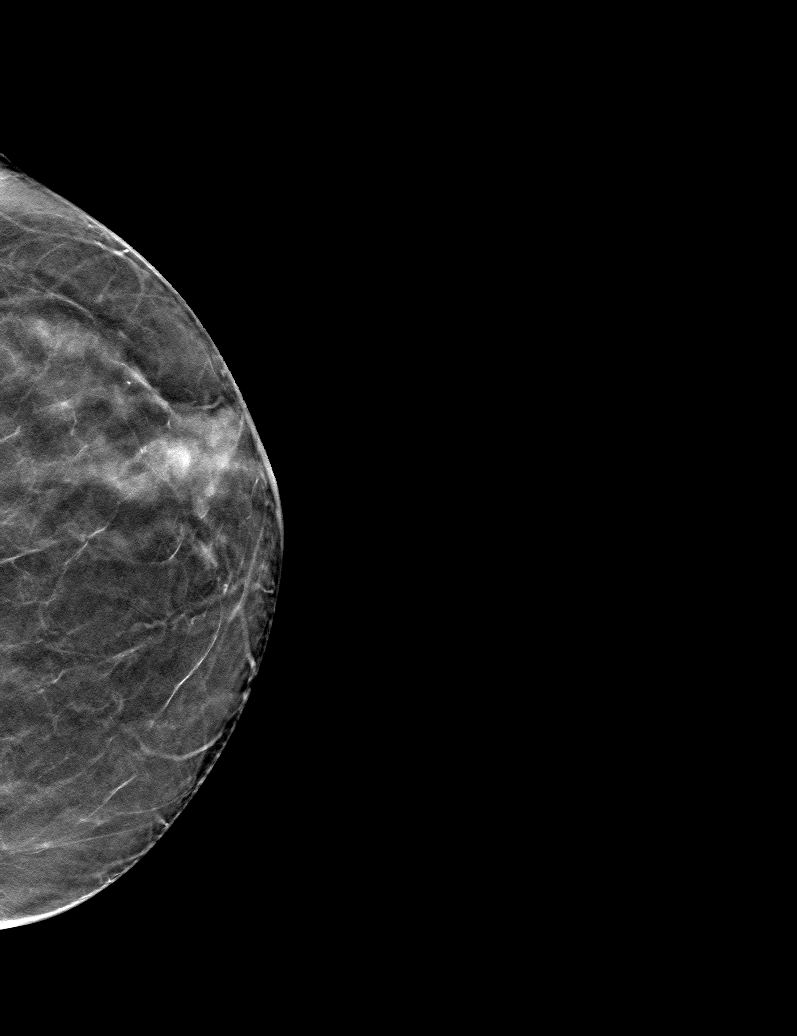

[6 of 18 positions shown; findings below may reference images not displayed]

ACR Breast Density Category b: There are scattered areas of
fibroglandular density.
FINDINGS: There are no findings suspicious for malignancy. Images were
processed with CAD.
IMPRESSION: No mammographic evidence of malignancy. A result letter of this
screening mammogram will be mailed directly to the patient.

RECOMMENDATION:
Screening mammogram in one year. (Code:51-Y-CJA)

BI-RADS CATEGORY  1: Negative.

## 2021-08-02 ENCOUNTER — Other Ambulatory Visit: Payer: Self-pay | Admitting: Internal Medicine

## 2021-08-02 DIAGNOSIS — Z1231 Encounter for screening mammogram for malignant neoplasm of breast: Secondary | ICD-10-CM

## 2021-09-08 ENCOUNTER — Ambulatory Visit
Admission: RE | Admit: 2021-09-08 | Discharge: 2021-09-08 | Disposition: A | Discharge status: Home / Self Care | Payer: Medicare Other | Source: Ambulatory Visit | Attending: Internal Medicine | Admitting: Internal Medicine

## 2021-09-08 DIAGNOSIS — Z1231 Encounter for screening mammogram for malignant neoplasm of breast: Secondary | ICD-10-CM | POA: Diagnosis present

## 2022-02-17 ENCOUNTER — Other Ambulatory Visit: Payer: Self-pay | Admitting: Obstetrics and Gynecology

## 2022-02-17 DIAGNOSIS — N6320 Unspecified lump in the left breast, unspecified quadrant: Secondary | ICD-10-CM

## 2022-02-17 DIAGNOSIS — Z853 Personal history of malignant neoplasm of breast: Secondary | ICD-10-CM

## 2022-02-22 ENCOUNTER — Ambulatory Visit
Admission: RE | Admit: 2022-02-22 | Discharge: 2022-02-22 | Disposition: A | Discharge status: Home / Self Care | Payer: Medicare Other | Source: Ambulatory Visit | Attending: Obstetrics and Gynecology | Admitting: Obstetrics and Gynecology

## 2022-02-22 DIAGNOSIS — N6320 Unspecified lump in the left breast, unspecified quadrant: Secondary | ICD-10-CM

## 2022-02-22 DIAGNOSIS — Z853 Personal history of malignant neoplasm of breast: Secondary | ICD-10-CM

## 2022-12-13 ENCOUNTER — Ambulatory Visit: Payer: Medicare Other

## 2022-12-13 DIAGNOSIS — J3489 Other specified disorders of nose and nasal sinuses: Secondary | ICD-10-CM

## 2022-12-13 DIAGNOSIS — K2970 Gastritis, unspecified, without bleeding: Secondary | ICD-10-CM

## 2022-12-13 DIAGNOSIS — R1084 Generalized abdominal pain: Secondary | ICD-10-CM

## 2022-12-13 DIAGNOSIS — Z860100 Personal history of colon polyps, unspecified: Secondary | ICD-10-CM

## 2022-12-13 DIAGNOSIS — K640 First degree hemorrhoids: Secondary | ICD-10-CM

## 2022-12-13 DIAGNOSIS — Z09 Encounter for follow-up examination after completed treatment for conditions other than malignant neoplasm: Secondary | ICD-10-CM

## 2022-12-13 DIAGNOSIS — D120 Benign neoplasm of cecum: Secondary | ICD-10-CM

## 2023-01-13 ENCOUNTER — Other Ambulatory Visit: Payer: Self-pay | Admitting: Internal Medicine

## 2023-01-13 DIAGNOSIS — Z1231 Encounter for screening mammogram for malignant neoplasm of breast: Secondary | ICD-10-CM

## 2023-02-06 ENCOUNTER — Ambulatory Visit
Admission: RE | Admit: 2023-02-06 | Discharge: 2023-02-06 | Disposition: A | Discharge status: Home / Self Care | Payer: Medicare Other | Source: Ambulatory Visit | Attending: Internal Medicine | Admitting: Internal Medicine

## 2023-02-06 ENCOUNTER — Other Ambulatory Visit: Payer: Self-pay | Admitting: Internal Medicine

## 2023-02-06 DIAGNOSIS — Z1231 Encounter for screening mammogram for malignant neoplasm of breast: Secondary | ICD-10-CM | POA: Diagnosis present

## 2024-01-01 ENCOUNTER — Other Ambulatory Visit: Payer: Self-pay | Admitting: Internal Medicine

## 2024-01-01 DIAGNOSIS — Z1231 Encounter for screening mammogram for malignant neoplasm of breast: Secondary | ICD-10-CM

## 2024-02-12 ENCOUNTER — Ambulatory Visit
Admission: RE | Admit: 2024-02-12 | Discharge: 2024-02-12 | Disposition: A | Discharge status: Home / Self Care | Payer: PRIVATE HEALTH INSURANCE | Source: Ambulatory Visit | Attending: Internal Medicine | Admitting: Internal Medicine

## 2024-02-12 DIAGNOSIS — Z1231 Encounter for screening mammogram for malignant neoplasm of breast: Secondary | ICD-10-CM | POA: Diagnosis present
